# Patient Record
Sex: Male | Born: 1975 | Race: Black or African American | Hispanic: No | Marital: Single | Smoking: Never smoker
Health system: Southern US, Community
[De-identification: ages and names within clinical notes are randomized; demographics above are authoritative.]

## PROBLEM LIST (undated history)

## (undated) DIAGNOSIS — I10 Essential (primary) hypertension: Secondary | ICD-10-CM

## (undated) DIAGNOSIS — E119 Type 2 diabetes mellitus without complications: Secondary | ICD-10-CM

## (undated) DIAGNOSIS — N289 Disorder of kidney and ureter, unspecified: Secondary | ICD-10-CM

## (undated) HISTORY — PX: NO PAST SURGERIES: SHX2092

## (undated) HISTORY — DX: Type 2 diabetes mellitus without complications: E11.9

---

## 2010-04-25 ENCOUNTER — Emergency Department (HOSPITAL_COMMUNITY): Admission: EM | Admit: 2010-04-25 | Discharge: 2010-04-25 | Payer: Self-pay | Admitting: Emergency Medicine

## 2010-05-03 ENCOUNTER — Emergency Department (HOSPITAL_COMMUNITY): Admission: EM | Admit: 2010-05-03 | Discharge: 2010-05-03 | Payer: Self-pay | Admitting: Emergency Medicine

## 2010-08-11 ENCOUNTER — Emergency Department (HOSPITAL_BASED_OUTPATIENT_CLINIC_OR_DEPARTMENT_OTHER)
Admission: EM | Admit: 2010-08-11 | Discharge: 2010-08-11 | Payer: Self-pay | Source: Home / Self Care | Admitting: Emergency Medicine

## 2010-12-09 ENCOUNTER — Emergency Department (HOSPITAL_COMMUNITY)
Admission: EM | Admit: 2010-12-09 | Discharge: 2010-12-10 | Disposition: A | Discharge status: Home / Self Care | Payer: Self-pay | Attending: Emergency Medicine | Admitting: Emergency Medicine

## 2010-12-09 DIAGNOSIS — I10 Essential (primary) hypertension: Secondary | ICD-10-CM | POA: Insufficient documentation

## 2010-12-09 DIAGNOSIS — J069 Acute upper respiratory infection, unspecified: Secondary | ICD-10-CM | POA: Insufficient documentation

## 2010-12-09 DIAGNOSIS — R0989 Other specified symptoms and signs involving the circulatory and respiratory systems: Secondary | ICD-10-CM | POA: Insufficient documentation

## 2010-12-09 DIAGNOSIS — J029 Acute pharyngitis, unspecified: Secondary | ICD-10-CM | POA: Insufficient documentation

## 2010-12-09 DIAGNOSIS — R0609 Other forms of dyspnea: Secondary | ICD-10-CM | POA: Insufficient documentation

## 2010-12-09 DIAGNOSIS — R059 Cough, unspecified: Secondary | ICD-10-CM | POA: Insufficient documentation

## 2010-12-09 DIAGNOSIS — R05 Cough: Secondary | ICD-10-CM | POA: Insufficient documentation

## 2010-12-09 DIAGNOSIS — R079 Chest pain, unspecified: Secondary | ICD-10-CM | POA: Insufficient documentation

## 2010-12-09 DIAGNOSIS — N289 Disorder of kidney and ureter, unspecified: Secondary | ICD-10-CM | POA: Insufficient documentation

## 2010-12-10 ENCOUNTER — Emergency Department (HOSPITAL_COMMUNITY): Payer: Self-pay

## 2010-12-10 ENCOUNTER — Encounter (HOSPITAL_COMMUNITY): Payer: Self-pay

## 2010-12-10 LAB — DIFFERENTIAL
Basophils Absolute: 0 10*3/uL (ref 0.0–0.1)
Basophils Relative: 0 % (ref 0–1)
Eosinophils Relative: 3 % (ref 0–5)
Lymphocytes Relative: 15 % (ref 12–46)
Neutro Abs: 10.3 10*3/uL — ABNORMAL HIGH (ref 1.7–7.7)

## 2010-12-10 LAB — BASIC METABOLIC PANEL
BUN: 11 mg/dL (ref 6–23)
Calcium: 9 mg/dL (ref 8.4–10.5)
Chloride: 103 mEq/L (ref 96–112)
Creatinine, Ser: 1.48 mg/dL (ref 0.4–1.5)
GFR calc Af Amer: >60 mL/min (ref >60)
GFR calc non Af Amer: 54 mL/min — ABNORMAL LOW (ref >60)

## 2010-12-10 LAB — POCT I-STAT, CHEM 8
Chloride: 107 mEq/L (ref 96–112)
Glucose, Bld: 107 mg/dL — ABNORMAL HIGH (ref 70–99)
HCT: 46 % (ref 39.0–52.0)
Potassium: 3.9 mEq/L (ref 3.5–5.1)
Sodium: 144 mEq/L (ref 135–145)

## 2010-12-10 LAB — CBC
HCT: 44 % (ref 39.0–52.0)
RDW: 12.7 % (ref 11.5–15.5)
WBC: 14 10*3/uL — ABNORMAL HIGH (ref 4.0–10.5)

## 2010-12-10 LAB — RAPID STREP SCREEN (MED CTR MEBANE ONLY): Streptococcus, Group A Screen (Direct): NEGATIVE

## 2010-12-10 LAB — MONONUCLEOSIS SCREEN: Mono Screen: NEGATIVE

## 2010-12-10 LAB — D-DIMER, QUANTITATIVE: D-Dimer, Quant: 0.62 ug/mL-FEU — ABNORMAL HIGH (ref 0.00–0.48)

## 2010-12-10 MED ORDER — IOHEXOL 300 MG/ML  SOLN
100.0000 mL | Freq: Once | INTRAMUSCULAR | Status: AC | PRN
  Administered 2010-12-10: 100 mL via INTRAVENOUS

## 2011-05-01 ENCOUNTER — Encounter (HOSPITAL_BASED_OUTPATIENT_CLINIC_OR_DEPARTMENT_OTHER): Payer: Self-pay | Admitting: Family Medicine

## 2011-05-01 ENCOUNTER — Emergency Department (HOSPITAL_BASED_OUTPATIENT_CLINIC_OR_DEPARTMENT_OTHER)
Admission: EM | Admit: 2011-05-01 | Discharge: 2011-05-01 | Disposition: A | Discharge status: Home / Self Care | Payer: Self-pay | Attending: Emergency Medicine | Admitting: Emergency Medicine

## 2011-05-01 DIAGNOSIS — K0889 Other specified disorders of teeth and supporting structures: Secondary | ICD-10-CM

## 2011-05-01 DIAGNOSIS — K089 Disorder of teeth and supporting structures, unspecified: Secondary | ICD-10-CM | POA: Insufficient documentation

## 2011-05-01 MED ORDER — PENICILLIN V POTASSIUM 500 MG PO TABS: 500.0000 mg | ORAL_TABLET | Freq: Four times a day (QID) | ORAL | Status: AC

## 2011-05-01 MED ORDER — HYDROCODONE-ACETAMINOPHEN 5-325 MG PO TABS: 2.0000 | ORAL_TABLET | ORAL | Status: AC | PRN

## 2011-05-01 NOTE — ED Notes (Signed)
Pt c/o right upper toothache x 4-3 weeks.

## 2011-05-01 NOTE — ED Provider Notes (Signed)
Medical screening examination/treatment/procedure(s) were performed by non-physician practitioner and as supervising physician I was immediately available for consultation/collaboration.   Celene Kras, MD 05/01/11 [phone]

## 2011-05-01 NOTE — ED Provider Notes (Addendum)
History     CSN: [id number] Arrival date & time: 05/01/2011  5:42 PM   First MD Initiated Contact with Patient 05/01/11 1750      Chief Complaint  Patient presents with  . Dental Pain    (Consider location/radiation/quality/duration/timing/severity/associated sxs/prior treatment) Patient is a 35 y.o. male presenting with tooth pain. The history is provided by the patient. No language interpreter was used.  Dental PainThe primary symptoms include mouth pain and dental injury. The symptoms began more than 1 week ago. The symptoms are worsening. The symptoms are new. The symptoms occur constantly.  Additional symptoms include: gum tenderness. Medical issues do not include: alcohol problem and smoking.  Pt does not have a dentist  History reviewed. No pertinent past medical history.  History reviewed. No pertinent past surgical history.  No family history on file.  History  Substance Use Topics  . Smoking status: Never Smoker   . Smokeless tobacco: Not on file  . Alcohol Use: No      Review of Systems  HENT: Positive for dental problem.   All other systems reviewed and are negative.    Allergies  Review of patient's allergies indicates no known allergies.  Home Medications   Current Outpatient Rx  Name Route Sig Dispense Refill  . ACETAMINOPHEN 325 MG PO TABS Oral Take 650 mg by mouth every 6 (six) hours as needed. For pain       BP 135/84  Pulse 74  Temp(Src) 98.3 F (36.8 C) (Oral)  Resp 16  Ht 5\' 6"  (1.676 m)  Wt 175 lb (79.379 kg)  BMI 28.25 kg/m2  SpO2 100%  Physical Exam  Nursing note and vitals reviewed. Constitutional: He is oriented to person, place, and time. He appears well-developed and well-nourished.  HENT:  Head: Normocephalic and atraumatic.  Right Ear: External ear normal.  Left Ear: External ear normal.       Teeth no abscess  Eyes: Pupils are equal, round, and reactive to light.  Neck: Normal range of motion.  Cardiovascular:  Normal rate.   Pulmonary/Chest: Effort normal.  Musculoskeletal: Normal range of motion.  Neurological: He is alert and oriented to person, place, and time. He has normal reflexes.  Skin: Skin is warm.  Psychiatric: He has a normal mood and affect.    ED Course  Procedures (including critical care time)  Labs Reviewed - No data to display No results found.   No diagnosis found.    MDM   pt referred to dentist.         Langston Masker, PA 05/01/11 1828  Langston Masker, [place] 05/11/11 [phone]

## 2011-05-13 NOTE — ED Provider Notes (Signed)
Medical screening examination/treatment/procedure(s) were performed by non-physician practitioner and as supervising physician I was immediately available for consultation/collaboration.   Celene Kras, MD 05/13/11 [phone]

## 2011-05-26 ENCOUNTER — Emergency Department (HOSPITAL_BASED_OUTPATIENT_CLINIC_OR_DEPARTMENT_OTHER)
Admission: EM | Admit: 2011-05-26 | Discharge: 2011-05-26 | Disposition: A | Discharge status: Home / Self Care | Payer: Worker's Compensation | Attending: Emergency Medicine | Admitting: Emergency Medicine

## 2011-05-26 ENCOUNTER — Other Ambulatory Visit: Payer: Self-pay

## 2011-05-26 ENCOUNTER — Encounter (HOSPITAL_BASED_OUTPATIENT_CLINIC_OR_DEPARTMENT_OTHER): Payer: Self-pay | Admitting: *Deleted

## 2011-05-26 ENCOUNTER — Emergency Department (HOSPITAL_BASED_OUTPATIENT_CLINIC_OR_DEPARTMENT_OTHER): Payer: Worker's Compensation

## 2011-05-26 DIAGNOSIS — J40 Bronchitis, not specified as acute or chronic: Secondary | ICD-10-CM

## 2011-05-26 DIAGNOSIS — R054 Cough syncope: Secondary | ICD-10-CM

## 2011-05-26 DIAGNOSIS — R55 Syncope and collapse: Secondary | ICD-10-CM | POA: Insufficient documentation

## 2011-05-26 DIAGNOSIS — R05 Cough: Secondary | ICD-10-CM

## 2011-05-26 MED ORDER — DOXYCYCLINE HYCLATE 100 MG PO CAPS: 100.0000 mg | ORAL_CAPSULE | Freq: Two times a day (BID) | ORAL | Status: AC

## 2011-05-26 MED ORDER — HYDROCOD POLST-CHLORPHEN POLST 10-8 MG/5ML PO LQCR: 5.0000 mL | Freq: Two times a day (BID) | ORAL | Status: DC

## 2011-05-26 MED ORDER — HYDROCOD POLST-CHLORPHEN POLST 10-8 MG/5ML PO LQCR
5.0000 mL | Freq: Once | ORAL | Status: AC
  Administered 2011-05-26: 5 mL via ORAL
  Filled 2011-05-26: qty 5

## 2011-05-26 MED ORDER — ALBUTEROL SULFATE HFA 108 (90 BASE) MCG/ACT IN AERS
2.0000 | INHALATION_SPRAY | Freq: Once | RESPIRATORY_TRACT | Status: AC
  Administered 2011-05-26: 2 via RESPIRATORY_TRACT
  Filled 2011-05-26: qty 6.7
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
## 2011-05-26 NOTE — Discharge Instructions (Signed)
 Your EKG is normal.  I advise that you use inhaler 2 puffs every 6 hours while awake, then you may use just as needed after 48 hours.  Tussionex contains hydrocodone  which is a narcotic so use with caution.  I advise that you establish with a primary care provider and to have further evaluation with cardiology in the next 1-2 weeks.  Take the doxycycline  antibiotics to completion as prescribed.
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
## 2011-11-15 ENCOUNTER — Emergency Department (HOSPITAL_COMMUNITY)
Admission: EM | Admit: 2011-11-15 | Discharge: 2011-11-15 | Disposition: A | Discharge status: Home / Self Care | Payer: Self-pay | Attending: Emergency Medicine | Admitting: Emergency Medicine

## 2011-11-15 ENCOUNTER — Encounter (HOSPITAL_COMMUNITY): Payer: Self-pay | Admitting: Family Medicine

## 2011-11-15 DIAGNOSIS — T7840XA Allergy, unspecified, initial encounter: Secondary | ICD-10-CM | POA: Insufficient documentation

## 2011-11-15 DIAGNOSIS — I10 Essential (primary) hypertension: Secondary | ICD-10-CM | POA: Insufficient documentation

## 2011-11-15 DIAGNOSIS — R062 Wheezing: Secondary | ICD-10-CM | POA: Insufficient documentation

## 2011-11-15 DIAGNOSIS — R0602 Shortness of breath: Secondary | ICD-10-CM | POA: Insufficient documentation

## 2011-11-15 DIAGNOSIS — J3489 Other specified disorders of nose and nasal sinuses: Secondary | ICD-10-CM | POA: Insufficient documentation

## 2011-11-15 DIAGNOSIS — J9801 Acute bronchospasm: Secondary | ICD-10-CM | POA: Insufficient documentation

## 2011-11-15 MED ORDER — ALBUTEROL SULFATE HFA 108 (90 BASE) MCG/ACT IN AERS
2.0000 | INHALATION_SPRAY | RESPIRATORY_TRACT | Status: DC | PRN
  Administered 2011-11-15: 2 via RESPIRATORY_TRACT
  Filled 2011-11-15: qty 6.7

## 2011-11-15 MED ORDER — IPRATROPIUM BROMIDE 0.02 % IN SOLN
0.5000 mg | Freq: Once | RESPIRATORY_TRACT | Status: AC
  Administered 2011-11-15: 0.5 mg via RESPIRATORY_TRACT
  Filled 2011-11-15: qty 2.5

## 2011-11-15 MED ORDER — PREDNISONE 20 MG PO TABS: 60.0000 mg | ORAL_TABLET | Freq: Every day | ORAL | Status: AC

## 2011-11-15 MED ORDER — PREDNISONE 20 MG PO TABS
60.0000 mg | ORAL_TABLET | Freq: Once | ORAL | Status: AC
  Administered 2011-11-15: 60 mg via ORAL
  Filled 2011-11-15: qty 3

## 2011-11-15 MED ORDER — ALBUTEROL SULFATE (5 MG/ML) 0.5% IN NEBU
5.0000 mg | INHALATION_SOLUTION | Freq: Once | RESPIRATORY_TRACT | Status: AC
  Administered 2011-11-15: 5 mg via RESPIRATORY_TRACT
  Filled 2011-11-15: qty 1

## 2011-11-15 NOTE — ED Provider Notes (Signed)
History     CSN: [id number]  Arrival date & time 11/15/11  2014   First MD Initiated Contact with Patient 11/15/11 2103      Chief Complaint  Patient presents with  . Shortness of Breath    (Consider location/radiation/quality/duration/timing/severity/associated sxs/prior treatment) Patient is a 36 y.o. male presenting with shortness of breath. The history is provided by the patient.  Shortness of Breath  The current episode started today. The problem occurs continuously. The problem has been gradually worsening. The problem is moderate. The symptoms are relieved by nothing. Associated symptoms include rhinorrhea, shortness of breath and wheezing. Pertinent negatives include no chest pain and no fever. Associated symptoms comments: He began having trouble breathing after mowing the grass today without his usual mask. He denies fever but has had nasal congestion and sneezing since mowing as well. No known history of asthma, nonsmoker. He reports similar milder reactions in the past. .    History reviewed. No pertinent past medical history.  History reviewed. No pertinent past surgical history.  No family history on file.  History  Substance Use Topics  . Smoking status: Never Smoker   . Smokeless tobacco: Not on file  . Alcohol Use: No      Review of Systems  Constitutional: Negative for fever.  HENT: Positive for rhinorrhea.   Respiratory: Positive for shortness of breath and wheezing.   Cardiovascular: Negative for chest pain.    Allergies  Review of patient's allergies indicates no known allergies.  Home Medications   Current Outpatient Rx  Name Route Sig Dispense Refill  . DIPHENHYDRAMINE HCL 12.5 MG/5ML PO ELIX Oral Take 25 mg by mouth 4 (four) times daily as needed. For allergies    . GUAIFENESIN 100 MG/5ML PO SYRP Oral Take 200 mg by mouth 3 (three) times daily as needed. For cough      BP 141/78  Pulse 81  Temp(Src) 98.8 F (37.1 C) (Oral)  Resp 24   SpO2 97%  Physical Exam  Constitutional: He is oriented to person, place, and time. He appears well-developed and well-nourished. No distress.  Neck: Normal range of motion.  Cardiovascular: Normal rate.   No murmur heard. Pulmonary/Chest: Effort normal. No respiratory distress. He has wheezes.  Musculoskeletal: He exhibits no edema.  Neurological: He is alert and oriented to person, place, and time.    ED Course  Procedures (including critical care time)  Labs Reviewed - No data to display No results found.   No diagnosis found.  1. Bronchospasm 2. Allergies   MDM  No further wheezing after one nebulizer treatment. The patient feels better. Prednisone given here and will continue for the next 3 days. Recommend Zyrtec or Claritin for daily use.         Rodena Medin, PA-C 11/15/11 2237

## 2011-11-15 NOTE — Discharge Instructions (Signed)
FOLLOW UP WITH A PRIMARY CARE DOCTOR OF YOUR CHOICE FOR FURTHER MANAGEMENT OF BRONCHOSPASM AND RETURN HERE AS NEEDED.  Bronchospasm, Adult Bronchospasm means that there is a spasm or tightening of the airways going into the lungs. Because the airways go into a spasm and get smaller it makes breathing more difficult. For reasons not completely known, workings (functions) of the airways designed to protect the lungs become over active. This causes the airways to become more sensitive to:  Infection.   Weather.   Exercise.   Irritants.   Things that cause allergic reactions or allergies (allergens).  Frequent coughing or respiratory episodes should be checked for the cause. This condition may be made worse by exercise. CAUSES  Inflammation is often the cause of this condition. Allergy, viral respiratory infections, or irritants in the air often cause this problem. Allergic reactions produce immediate and delayed responses. Late reactions may produce more serious inflammation. This may lead to increased reactivity of the airways. Sometimes this is inherited. Some common triggers are:  Allergies.   Infection commonly triggers attacks. Antibiotics are not helpful for viral infections and usually do not help with attacks of bronchospasm.   Exercise (running, etc.) can trigger an attack. Proper pre-exercise medications help most individuals participate in sports. Swimming is the least likely sport to cause problems.   Irritants (for example, pollution, cigarette smoke, strong odors, aerosol sprays, paint fumes, etc.) may trigger attacks. You cannot smoke and do not allow smoking in your home. This is absolutely necessary. Show this instruction to mates, relatives and significant others that may not agree with you.   Weather changes may cause lung problems but moving around trying to find an ideal climate does not seem to be overly helpful. Winds increase molds and pollens in the air. Rain  refreshes the air by washing irritants out. Cold air may cause irritation.   Emotional problems do not cause lung problems but can trigger attacks.  SYMPTOMS  Wheezing is the most common symptom. Frequent coughing (with or without exercise and or crying) and repeated respiratory infections are all early warning signs of bronchospasm. Chest tightness and shortness of breath are other symptoms. DIAGNOSIS  Early hidden bronchospasm may go for long periods of time without being detected. This is especially true if wheezing cannot be detected by your caregiver. Lung (pulmonary) function studies may help with diagnosis in these cases. HOME CARE INSTRUCTIONS   It is necessary to remain calm during an attack. Try to relax and breathe more slowly. During this time medications may be given. If any breathing problems seem to be getting worse and are unresponsive to treatment seek immediate medical care.   If you have severe breathing difficulty or have had a life threatening attack it is probably a good idea for you to learn how to give adrenaline (epi-pen) or use an anaphylaxis kit. Your caregiver can help you with this. These are the same kits carried by people who have severe allergic reactions. This is especially important if you do not have readily accessible medical care.   With any severe breathing problems where epinephrine (adrenaline) has been given at home call 911 immediately as the delayed reaction may be even more severe.  SEEK MEDICAL CARE IF:   There is wheezing and shortness of breath, even if medications are given to prevent attacks.   An oral temperature above 102 F (38.9 C) develops.   There are muscle aches, chest pain, or thickening of sputum.   The sputum  changes from clear or white to yellow, green, gray, or bloody.   There are problems that may be related to the medicine you are given, such as a rash, itching, swelling, or trouble breathing.  SEEK IMMEDIATE MEDICAL CARE IF:     The usual medicines do not stop your wheezing, or there is increased coughing.   You have increased difficulty breathing.  MAKE SURE YOU:   Understand these instructions.   Will watch your condition.   Will get help right away if you are not doing well or get worse.  Document Released: 07/03/2003 Document Revised: 06/19/2011 Document Reviewed: 02/16/2008 [facility] Patient Information 2012 [place], [place].  Using Your Inhaler 1. Take the cap off the mouthpiece.  2. Shake the inhaler for 5 seconds.  3. Turn the inhaler so the bottle is above the mouthpiece. Hold it away from your mouth, at a distance of the width of 2 fingers.  4. Open your mouth widely, and tilt your head back slightly. Let your breath out.  5. Take a deep breath in slowly through your mouth. At the same time, push down on the bottle 1 time. You will feel the medicine enter your mouth and throat as you breathe.  6. Continue to take a deep breath in very slowly.  7. After you have breathed in completely, hold your breath for 10 seconds. This will help the medicine to settle in your lungs. If you cannot hold your breath for 10 seconds, hold it for as long as you can before you breathe out.  8. If your doctor has told you to take more than 1 puff, wait at least 1 minute between puffs. This will help you get the best results from your medicine.  9. If you use a steroid inhaler, rinse out your mouth after each dose.  10. Wash your inhaler once a day. Remove the bottle from the mouthpiece. Rinse the mouthpiece and cap with warm water. Dry everything well before you put the inhaler back together.  Document Released: 04/08/2008 Document Revised: 06/19/2011 Document Reviewed: 04/17/2009 [facility] Patient Information 2012 [place], [place].

## 2011-11-15 NOTE — ED Notes (Signed)
Respiratory paged for neb treatment.

## 2011-11-15 NOTE — ED Provider Notes (Signed)
Medical screening examination/treatment/procedure(s) were performed by non-physician practitioner and as supervising physician I was immediately available for consultation/collaboration.  Doug Sou, MD 11/15/11 2326

## 2011-11-15 NOTE — ED Notes (Signed)
Patient states he started having shortness of breath this evening after cutting the grass.

## 2012-11-12 IMAGING — CT CT ANGIO CHEST
2 of 8 series · 19 of 36 positions shown · IV contrast (APPLIED)
Comparison: Chest radiograph performed earlier today at [DATE] a.m.

CLINICAL DATA: Chest pain, cough and elevated D-dimer.
Leukocytosis.

CT ANGIOGRAPHY CHEST WITH CONTRAST
TECHNIQUE: Multidetector CT imaging of the chest was performed
using the standard protocol during bolus administration of
intravenous contrast.  Multiplanar CT image reconstructions
including MIPs were obtained to evaluate the vascular anatomy.
Contrast:  100 mL of Omnipaque 300 IV contrast

[Series 10: pe thins @ 1mm · axial · 0.63mm/px · z∈[+1225,+1463]mm · 17 of 268 slices shown]
[im 15/268  lung]
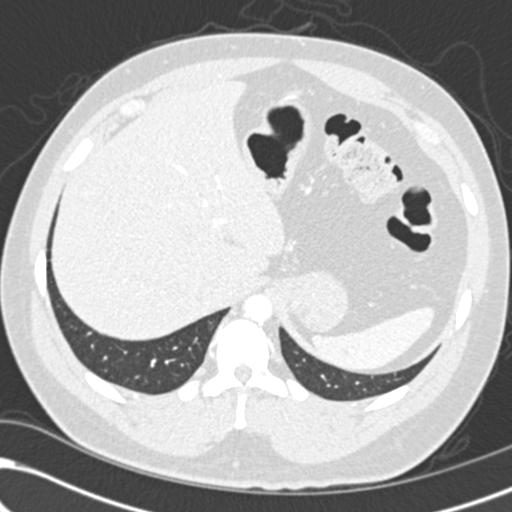
[im 30/268  mediastinal]
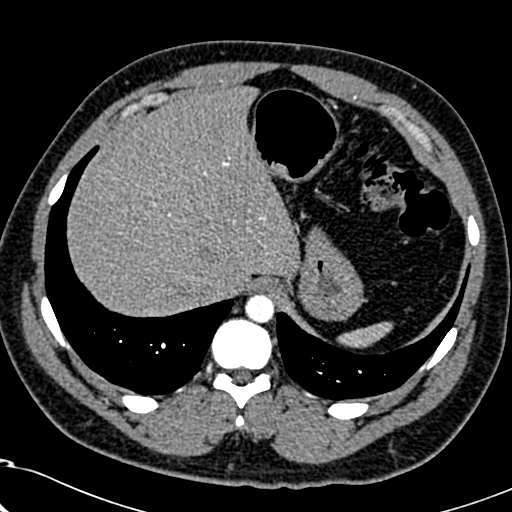
[im 45/268  lung]
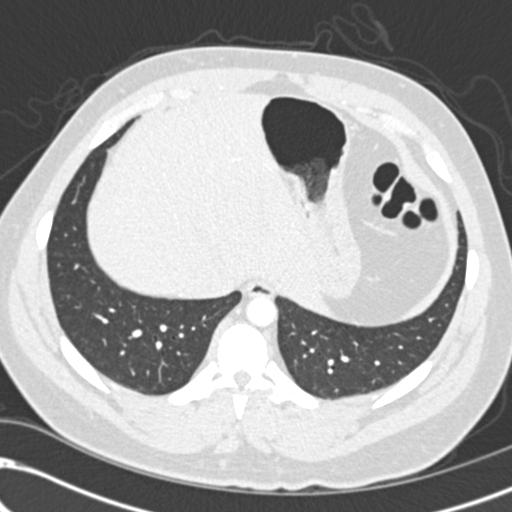
[im 60/268  mediastinal]
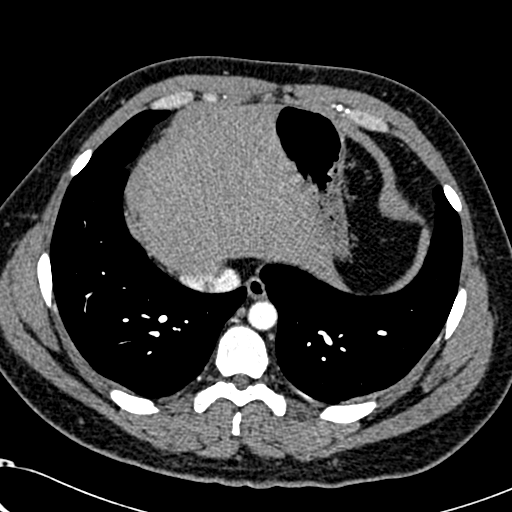
[im 75/268  lung]
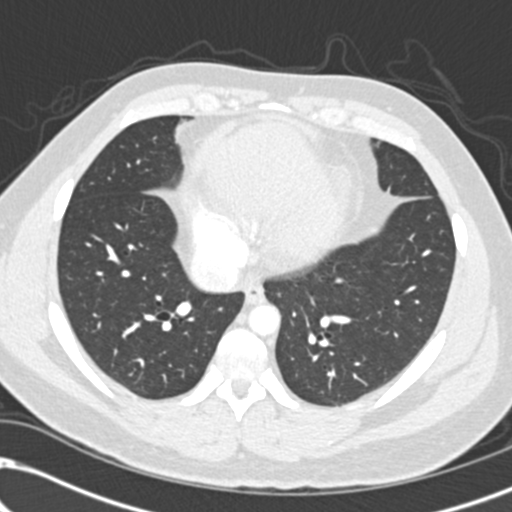
[im 90/268  mediastinal]
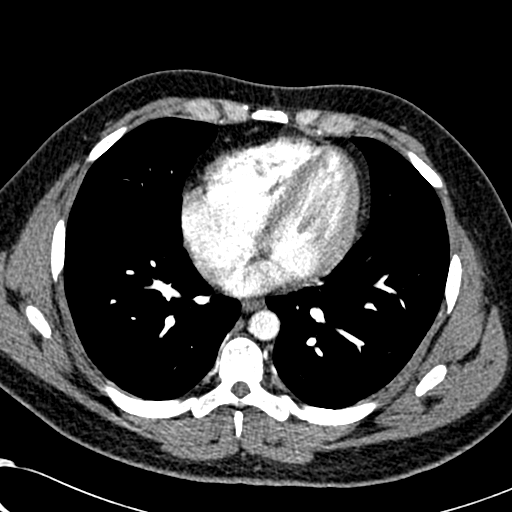
[im 104/268  lung]
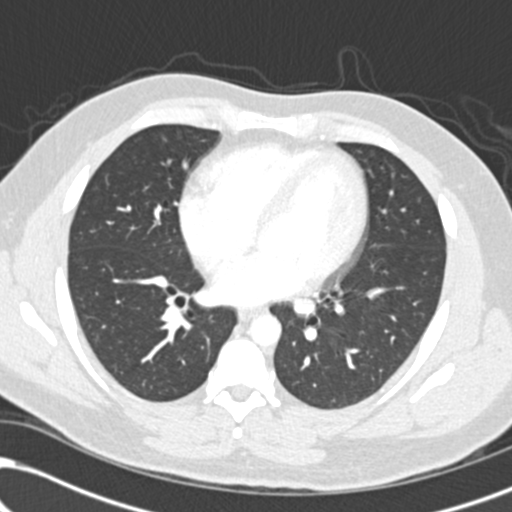
[im 119/268  mediastinal]
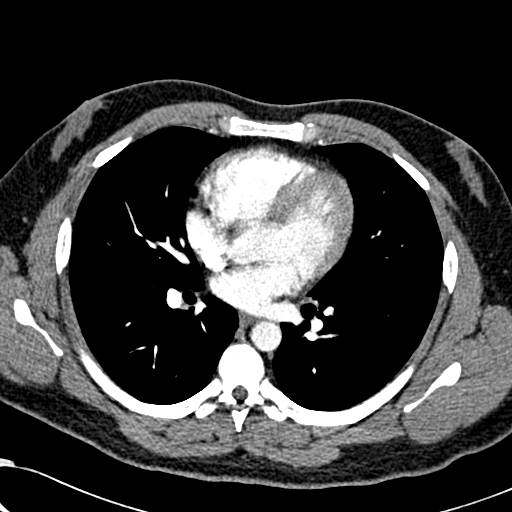
[im 134/268  lung]
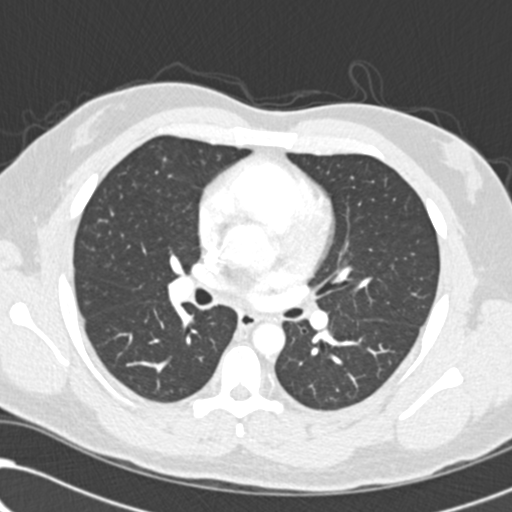
[im 149/268  mediastinal]
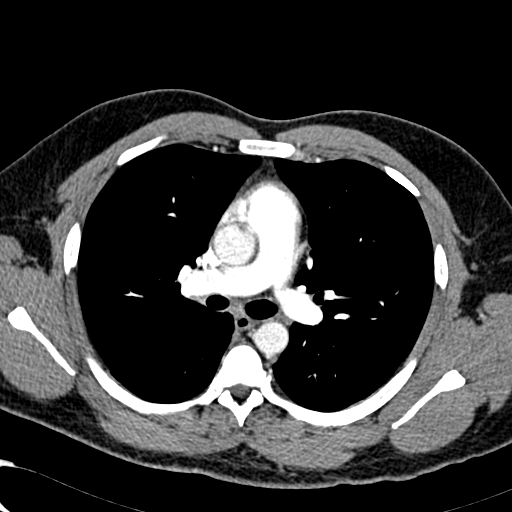
[im 164/268  lung]
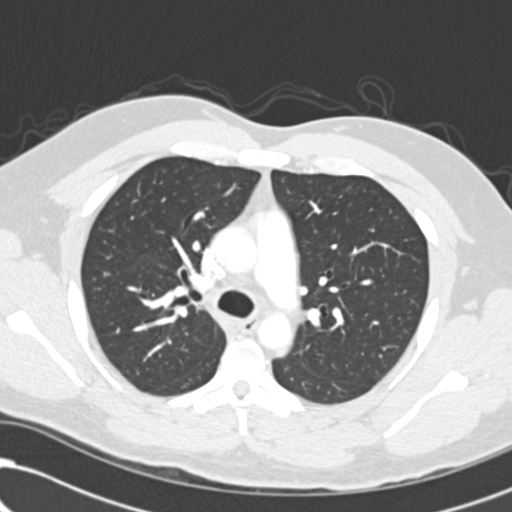
[im 179/268  mediastinal]
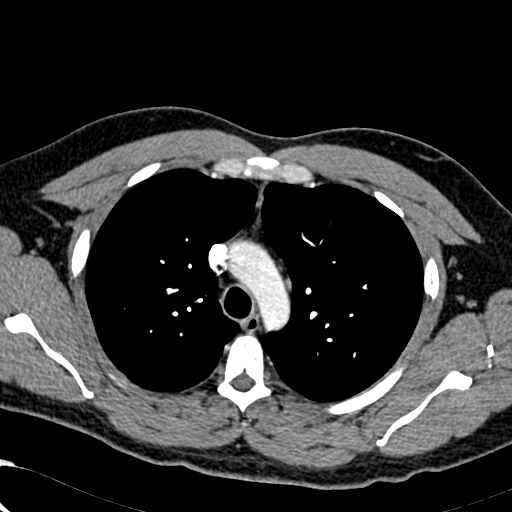
[im 193/268  lung]
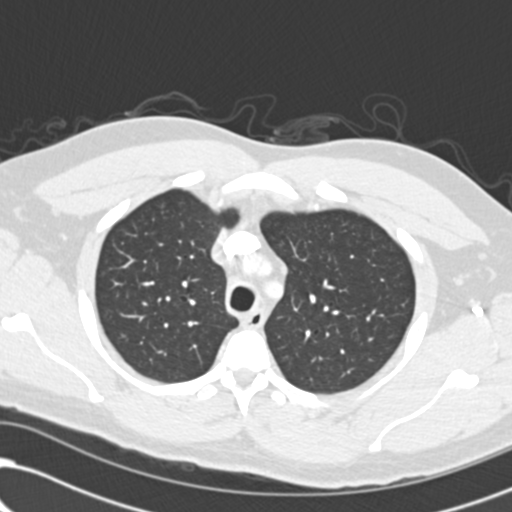
[im 208/268  mediastinal]
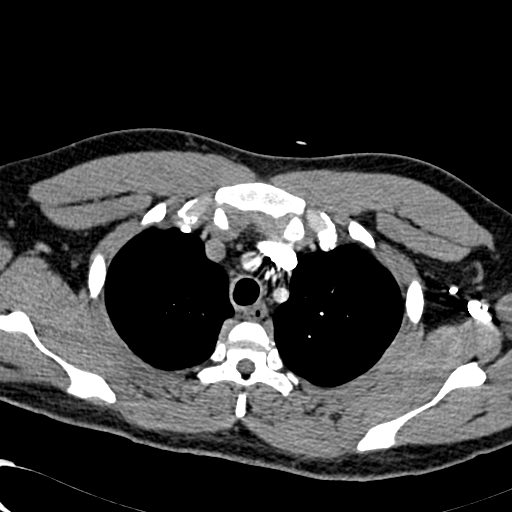
[im 223/268  lung]
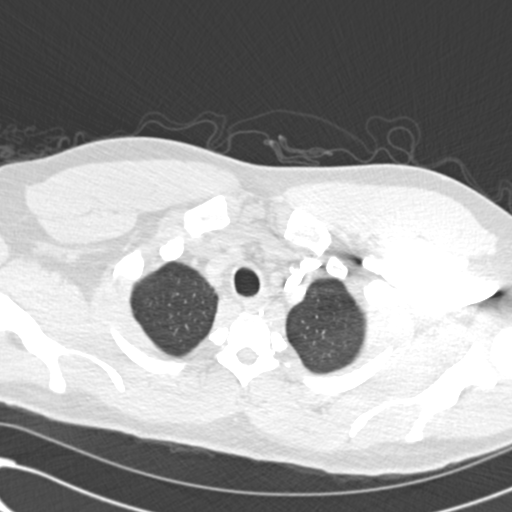
[im 238/268  mediastinal]
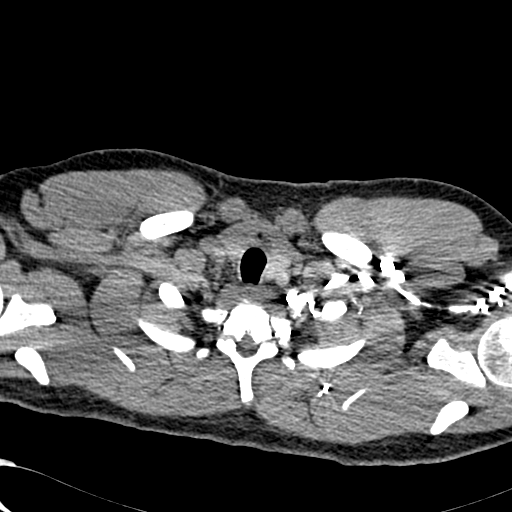
[im 253/268  lung]
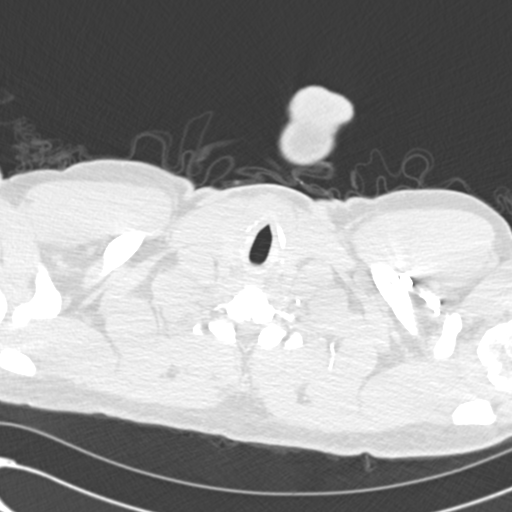

[Series 604: <mpr thick range> · coronal · 0.63mm/px · 2 of 101 slices shown]
[im 34/101  mediastinal]
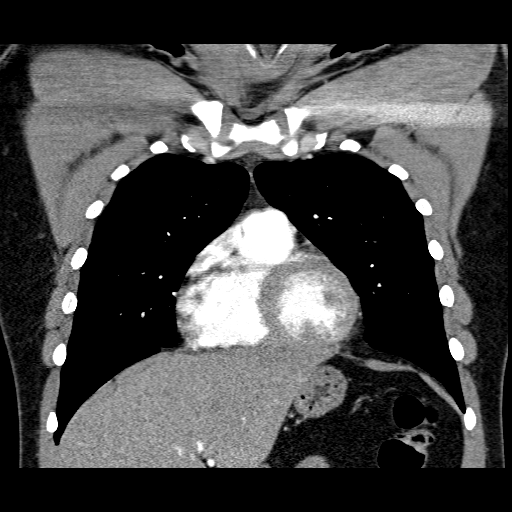
[im 67/101  mediastinal]
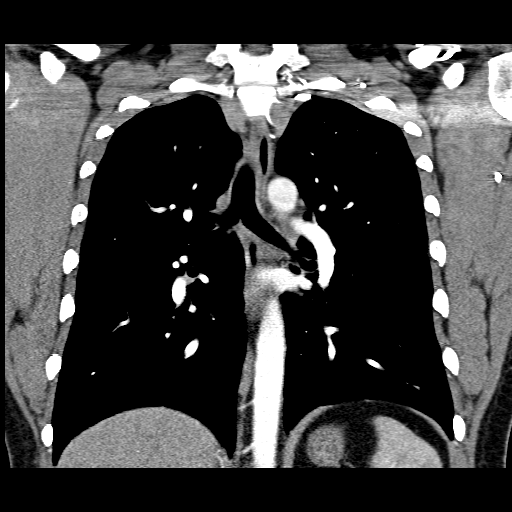

[19 of 36 positions shown; findings below may reference images not displayed]

FINDINGS: There is no evidence of pulmonary embolus.

The lungs are clear bilaterally.  There is no evidence of
significant focal consolidation, pleural effusion or pneumothorax.
No masses are identified; no abnormal focal contrast enhancement is
seen.

The mediastinum is unremarkable in appearance.  Scattered small
aortopulmonary window, precarinal, right paratracheal and
periaortic nodes remain within normal limits.  No mediastinal
lymphadenopathy is seen.  The great vessels are unremarkable in
appearance.  No pericardial effusion is identified.  No axillary
lymphadenopathy is seen.  The thyroid gland is unremarkable in
appearance.

The visualized portions of the liver and spleen are unremarkable.
Minimal bilateral gynecomastia is noted.

No acute osseous abnormalities are seen.

Review of the MIP images confirms the above findings.
IMPRESSION: 1.  No evidence of pulmonary embolus.
2.  Lungs clear bilaterally.
3.  Minimal bilateral gynecomastia incidentally noted.

## 2013-05-22 ENCOUNTER — Encounter (HOSPITAL_COMMUNITY): Payer: Self-pay | Admitting: Emergency Medicine

## 2013-05-22 ENCOUNTER — Emergency Department (HOSPITAL_COMMUNITY)
Admission: EM | Admit: 2013-05-22 | Discharge: 2013-05-22 | Disposition: A | Discharge status: Home / Self Care | Payer: Worker's Compensation | Attending: Emergency Medicine | Admitting: Emergency Medicine

## 2013-05-22 DIAGNOSIS — K047 Periapical abscess without sinus: Secondary | ICD-10-CM | POA: Insufficient documentation

## 2013-05-22 MED ORDER — PENICILLIN V POTASSIUM 500 MG PO TABS: 500.0000 mg | ORAL_TABLET | Freq: Four times a day (QID) | ORAL | Status: AC

## 2013-05-22 MED ORDER — OXYCODONE-ACETAMINOPHEN 5-325 MG PO TABS: 2.0000 | ORAL_TABLET | ORAL | Status: DC | PRN

## 2013-05-22 NOTE — ED Notes (Signed)
Pt had toothache and attempted to pull tooth himself. Pt does not believe that he removed the tooth completely and is causing severe pain. Pt is A&O and in NAD

## 2013-05-22 NOTE — ED Provider Notes (Signed)
CSN: [id number]     Arrival date & time 05/22/13  1558 History   First MD Initiated Contact with Patient 05/22/13 1631     Chief Complaint  Patient presents with  . Dental Pain   (Consider location/radiation/quality/duration/timing/severity/associated sxs/prior Treatment) HPI Comments: Patient presents with pain to his teeth. He states he's had a two-week history of some worsening throbbing pain to his left upper tooth. He attempted to pull a tooth out himself with some pliers. He feels like he might have gotten all the tooth out. Over the last few days he's noted a little bit of swelling around the tooth. He denies any fevers or vomiting. He currently does not have a dentist.  Patient is a 37 y.o. male presenting with tooth pain.  Dental Pain Associated symptoms: no facial swelling, no fever and no headaches     History reviewed. No pertinent past medical history. History reviewed. No pertinent past surgical history. No family history on file. History  Substance Use Topics  . Smoking status: Never Smoker   . Smokeless tobacco: Not on file  . Alcohol Use: No    Review of Systems  Constitutional: Negative for fever.  HENT: Positive for dental problem. Negative for facial swelling.   Gastrointestinal: Negative for nausea and vomiting.  Skin: Negative for rash and wound.  Neurological: Negative for light-headedness and headaches.    Allergies  Review of patient's allergies indicates no known allergies.  Home Medications   Current Outpatient Rx  Name  Route  Sig  Dispense  Refill  . diphenhydrAMINE (BENADRYL) 12.5 MG/5ML elixir   Oral   Take 25 mg by mouth 4 (four) times daily as needed. For allergies         . guaifenesin (ROBITUSSIN) 100 MG/5ML syrup   Oral   Take 200 mg by mouth 3 (three) times daily as needed. For cough         . oxyCODONE-acetaminophen (PERCOCET) 5-325 MG per tablet   Oral   Take 2 tablets by mouth every 4 (four) hours as needed.   15 tablet  0   . penicillin v potassium (VEETID) 500 MG tablet   Oral   Take 1 tablet (500 mg total) by mouth 4 (four) times daily.   40 tablet   0    BP 153/81  Pulse 71  Temp(Src) 99 F (37.2 C) (Oral)  Resp 20  SpO2 100% Physical Exam  Constitutional: He is oriented to person, place, and time. He appears well-developed and well-nourished.  HENT:  Patient has tenderness around the socket of the left upper first molar. There is a small pocket of swelling adjacent to the area. There's no other facial swelling. There is no drainage. There is no induration to the area. There's no trismus.  Cardiovascular: Normal rate.   Pulmonary/Chest: Effort normal.  Neurological: He is alert and oriented to person, place, and time.  Skin: Skin is warm and dry.    ED Course  Procedures (including critical care time) Labs Review Labs Reviewed - No data to display Imaging Review No results found.  EKG Interpretation   None       MDM   1. Periapical abscess    Patient a small. Abscess. He's otherwise nontoxic appearing. I gave him a referral to follow up with the on-call dentist. Advised to return here for symptoms worsen. He was started on penicillin and Percocet.    Rolan Bucco, MD 05/22/13 2240

## 2014-02-17 ENCOUNTER — Emergency Department (HOSPITAL_BASED_OUTPATIENT_CLINIC_OR_DEPARTMENT_OTHER)
Admission: EM | Admit: 2014-02-17 | Discharge: 2014-02-17 | Disposition: A | Discharge status: Home / Self Care | Payer: Worker's Compensation | Attending: Emergency Medicine | Admitting: Emergency Medicine

## 2014-02-17 ENCOUNTER — Encounter (HOSPITAL_BASED_OUTPATIENT_CLINIC_OR_DEPARTMENT_OTHER): Payer: Self-pay | Admitting: Emergency Medicine

## 2014-02-17 DIAGNOSIS — K0889 Other specified disorders of teeth and supporting structures: Secondary | ICD-10-CM

## 2014-02-17 DIAGNOSIS — K089 Disorder of teeth and supporting structures, unspecified: Secondary | ICD-10-CM | POA: Insufficient documentation

## 2014-02-17 DIAGNOSIS — K029 Dental caries, unspecified: Secondary | ICD-10-CM | POA: Insufficient documentation

## 2014-02-17 DIAGNOSIS — Z79899 Other long term (current) drug therapy: Secondary | ICD-10-CM | POA: Insufficient documentation

## 2014-02-17 MED ORDER — HYDROCODONE-ACETAMINOPHEN 5-325 MG PO TABS: 1.0000 | ORAL_TABLET | ORAL | Status: DC | PRN

## 2014-02-17 MED ORDER — PENICILLIN V POTASSIUM 500 MG PO TABS: 500.0000 mg | ORAL_TABLET | Freq: Three times a day (TID) | ORAL | Status: DC

## 2014-02-17 NOTE — Discharge Instructions (Signed)
Dental Pain °A tooth ache may be caused by cavities (tooth decay). Cavities expose the nerve of the tooth to air and hot or cold temperatures. It may come from an infection or abscess (also called a boil or furuncle) around your tooth. It is also often caused by dental caries (tooth decay). This causes the pain you are having. °DIAGNOSIS  °Your caregiver can diagnose this problem by exam. °TREATMENT  °· If caused by an infection, it may be treated with medications which kill germs (antibiotics) and pain medications as prescribed by your caregiver. Take medications as directed. °· Only take over-the-counter or prescription medicines for pain, discomfort, or fever as directed by your caregiver. °· Whether the tooth ache today is caused by infection or dental disease, you should see your dentist as soon as possible for further care. °SEEK MEDICAL CARE IF: °The exam and treatment you received today has been provided on an emergency basis only. This is not a substitute for complete medical or dental care. If your problem worsens or new problems (symptoms) appear, and you are unable to meet with your dentist, call or return to this location. °SEEK IMMEDIATE MEDICAL CARE IF:  °· You have a fever. °· You develop redness and swelling of your face, jaw, or neck. °· You are unable to open your mouth. °· You have severe pain uncontrolled by pain medicine. °MAKE SURE YOU:  °· Understand these instructions. °· Will watch your condition. °· Will get help right away if you are not doing well or get worse. °Document Released: 06/30/2005 Document Revised: 09/22/2011 Document Reviewed: 02/16/2008 °ExitCare® Patient Information ©2015 [employer]. This information is not intended to replace advice given to you by your health care provider. Make sure you discuss any questions you have with your health care provider. ° °Dental Care and Dentist Visits °Dental care supports good overall health. Regular dental visits can also help you  avoid dental pain, bleeding, infection, and other more serious health problems in the future. It is important to keep the mouth healthy because diseases in the teeth, gums, and other oral tissues can spread to other areas of the body. Some problems, such as diabetes, heart disease, and pre-term labor have been associated with poor oral health.  °See your dentist every 6 months. If you experience emergency problems such as a toothache or broken tooth, go to the dentist right away. If you see your dentist regularly, you may catch problems early. It is easier to be treated for problems in the early stages.  °WHAT TO EXPECT AT A DENTIST VISIT  °Your dentist will look for many common oral health problems and recommend proper treatment. At your regular dental visit, you can expect: °· Gentle cleaning of the teeth and gums. This includes scraping and polishing. This helps to remove the sticky substance around the teeth and gums (plaque). Plaque forms in the mouth shortly after eating. Over time, plaque hardens on the teeth as tartar. If tartar is not removed regularly, it can cause problems. Cleaning also helps remove stains. °· Periodic X-rays. These pictures of the teeth and supporting bone will help your dentist assess the health of your teeth. °· Periodic fluoride treatments. Fluoride is a natural mineral shown to help strengthen teeth. Fluoride treatment involves applying a fluoride gel or varnish to the teeth. It is most commonly done in children. °· Examination of the mouth, tongue, jaws, teeth, and gums to look for any oral health problems, such as: °¨ Cavities (dental caries). This is   decay on the tooth caused by plaque, sugar, and acid in the mouth. It is best to catch a cavity when it is small. °¨ Inflammation of the gums caused by plaque buildup (gingivitis). °¨ Problems with the mouth or malformed or misaligned teeth. °¨ Oral cancer or other diseases of the soft tissues or jaws.  °KEEP YOUR TEETH AND GUMS  HEALTHY °For healthy teeth and gums, follow these general guidelines as well as your dentist's specific advice: °· Have your teeth professionally cleaned at the dentist every 6 months. °· Brush twice daily with a fluoride toothpaste. °· Floss your teeth daily.  °· Ask your dentist if you need fluoride supplements, treatments, or fluoride toothpaste. °· Eat a healthy diet. Reduce foods and drinks with added sugar. °· Avoid smoking. °TREATMENT FOR ORAL HEALTH PROBLEMS °If you have oral health problems, treatment varies depending on the conditions present in your teeth and gums. °· Your caregiver will most likely recommend good oral hygiene at each visit. °· For cavities, gingivitis, or other oral health disease, your caregiver will perform a procedure to treat the problem. This is typically done at a separate appointment. Sometimes your caregiver will refer you to another dental specialist for specific tooth problems or for surgery. °SEEK IMMEDIATE DENTAL CARE IF: °· You have pain, bleeding, or soreness in the gum, tooth, jaw, or mouth area. °· A permanent tooth becomes loose or separated from the gum socket. °· You experience a blow or injury to the mouth or jaw area. °Document Released: 03/12/2011 Document Revised: 09/22/2011 Document Reviewed: 03/12/2011 °ExitCare® Patient Information ©2015 [employer]. This information is not intended to replace advice given to you by your health care provider. Make sure you discuss any questions you have with your health care provider. ° ° °Emergency Department Resource Guide °1) Find a Doctor and Pay Out of Pocket °Although you won't have to find out who is covered by your insurance plan, it is a good idea to ask around and get recommendations. You will then need to call the office and see if the doctor you have chosen will accept you as a new patient and what types of options they offer for patients who are self-pay. Some doctors offer discounts or will set up payment plans  for their patients who do not have insurance, but you will need to ask so you aren't surprised when you get to your appointment. ° °2) Contact Your Local Health Department °Not all health departments have doctors that can see patients for sick visits, but many do, so it is worth a call to see if yours does. If you don't know where your local health department is, you can check in your phone book. The CDC also has a tool to help you locate your state's health department, and many state websites also have listings of all of their local health departments. ° °3) Find a Walk-in Clinic °If your illness is not likely to be very severe or complicated, you may want to try a walk in clinic. These are popping up all over the country in pharmacies, drugstores, and shopping centers. They're usually staffed by nurse practitioners or physician assistants that have been trained to treat common illnesses and complaints. They're usually fairly quick and inexpensive. However, if you have serious medical issues or chronic medical problems, these are probably not your best option. ° °No Primary Care Doctor: °- Call [facility] at  832-8000 - they can help you locate a primary care doctor that  accepts your   insurance, provides certain services, etc. °- Physician Referral Service- 1-800-533-3463 ° °Chronic Pain Problems: °Organization         Address  Phone   Notes  °[facility]  ([phone]) [phone] Patients need to be referred by their primary care doctor.  ° °Medication Assistance: °Organization         Address  Phone   Notes  °[place] [facility] [address] °[place], [place] [address] (336) 641-8030 --Must be a resident of [place] °-- Must have NO insurance coverage whatsoever (no Medicaid/ Medicare, etc.) °-- The pt. MUST have a primary care doctor that directs their care regularly and follows them in the community °  °[facility]  (866) 331-1348   °[employer]  (888)  892-1162   ° °Agencies that provide inexpensive medical care: °Organization         Address  Phone   Notes  °[facility]  (336) 832-8035   °[facility]    (336) 832-7272   °[facility] [address] °[place], [place] [address] (336) 832-4777   °[facility] [address], °[place] (336) 271-4999   °[facility]    (336) 373-0678   °[facility]    (336) 272-1050   °[facility] ° [address], [place] Phone:  (336) 832-4444, Fax:  (336) 832-4440 Hours of Operation:  9 am - 6 pm, M-F.  Also accepts Medicaid/Medicare and self-pay.  °[facility] ° [address], [place] Phone: (336) [phone], Fax: (336) [phone]. Hours of Operation:  8:30 am - 5:30 pm, M-F.  Also accepts Medicaid and self-pay.  °[facility] [place] [address], [place] Phone: (336) [phone]   °[facility] [address], [place], [place] (336)723-1848, Ext. 123 Mondays & Thursdays: 7-9 AM.  First 15 patients are seen on a first come, first serve basis. °  ° °Medicaid-accepting [place] Providers: ° °Organization         Address  Phone   Notes  °[facility] [address], [place] ([phone]) [phone] Also accepts self-pay patients.  °[facility] [address], [place] ° (336) [phone]   °[facility] [address], [place] (336) [phone]   °[facility] [address] [place] [address], [place] (336) [phone]   °[facility] [address], [place]  ° (336) [phone] Only accepts [place] Access Medicaid patients after they have their name applied to their card.  ° °Self-Pay (no insurance) in [place]: ° °Organization         Address  Phone   Notes  °Sickle Cell Patients, [facility] [address], [place] (336)  [phone]   °[facility] [address], [place] (336) 832-4400   °[facility] ° [address],  (336) [phone]   °[facility]/Dr. Osei-Bonsu ° [address] [place] [address], [place] or [address], [place] (336) [phone] Phone number for both [place] and [place] locations is the same.  °[facility] [address], [place] (336) [phone]   °[facility] [address] [place] [address], [place] or [address] (336) 852-7530 °(336) 878-2260   °[facility] [address], [place] (336) 350-1642, phone; (336) 294-5005, fax Sees patients 1st and 3rd Saturday of every month.  Must not qualify for public or private insurance (i.e. Medicaid, Medicare, [place] Health Choice, Veterans' Benefits) • Household income should be no more than 200% of the poverty level •The clinic cannot treat you if you are pregnant or think you are pregnant • Sexually transmitted diseases are not treated at the clinic.  ° ° °Dental Care: °Organization         Address  Phone  Notes  °[facility] [address], [place] (336) 641-6152 Accepts children up to age 21 who are enrolled in Medicaid or [place] Health Choice; pregnant women with a Medicaid card; and children who have applied for Medicaid or [place] Health Choice, but were declined, whose parents can pay a reduced fee at time of service.  °[facility] [place]  [address], [place] (336) 641-7733 Accepts children up to age 21 who are enrolled in Medicaid or [place] Health Choice; pregnant women with a Medicaid card; and children who have applied for Medicaid or [place] Health Choice, but were declined, whose parents can pay a reduced fee at time of service.  °[facility] ° [address], [place] (336) [phone] Patients are  seen by appointment only. Walk-ins are not accepted. [facility] will see patients 18 years of age and older. °Monday - Tuesday (8am-5pm) °Most Wednesdays (8:30-5pm) °$30 per visit, cash only  °[facility] ° [address], [place] (336) [phone] Patients are seen by appointment only. Walk-ins are not accepted. [facility] will see patients 18 years of age and older. °One Wednesday Evening (Monthly: Volunteer Based).  $30 per visit, cash only  °[facility]  ([phone]) 537-3737 for adults; Children under age 4, call Graduate Pediatric Dentistry at ([phone]) [phone]. Children aged 4-14, please call ([phone]) [phone] to request a pediatric application. ° Dental services are provided in all areas of dental care including fillings, crowns and bridges, complete and partial dentures, implants, gum treatment, root canals, and extractions. Preventive care is also provided. Treatment is provided to both adults and children. °Patients are selected via a lottery and there is often a waiting list. °  °[facility] [address], °[place] ° (336) 763-8833 [email or web address] °  °[facility] [address], [place], [place] (336)723-1848, Ext. 123 Second and Fourth Thursday of each month, opens at 6:30 AM; Clinic ends at 9 AM.  Patients are seen on a first-come first-served basis, and a limited number are seen during each clinic.  ° °[facility] ° [address], [place], [place] (336) [phone]   Eligibility Requirements °You must have lived in [place], [place], or [place] counties for at least the last three months. °  You cannot be eligible for state or federal sponsored healthcare insurance, including Veterans Administration, Medicaid, or Medicare. °  You generally cannot be eligible for healthcare insurance through your employer.  °  How to apply: °Eligibility screenings are held every Tuesday and Wednesday afternoon from 1:00 pm until 4:00  pm. You do not need an appointment for the interview!  °[facility] [address], [place],  [phone]   °[facility]  [phone]   °[facility]  [phone]   °[facility]  [phone]   ° °

## 2014-02-17 NOTE — ED Notes (Signed)
Broken tooth on left upper x1 month.  Started hurting today.

## 2014-03-07 NOTE — ED Provider Notes (Signed)
CSN: [id number]     Arrival date & time 02/17/14  1624 History   First MD Initiated Contact with Patient 02/17/14 1630     Chief Complaint  Patient presents with  . Dental Pain     (Consider location/radiation/quality/duration/timing/severity/associated sxs/prior Treatment) Patient is a 38 y.o. male presenting with tooth pain. The history is provided by the patient.  Dental Pain Location:  Upper Upper teeth location:  14/LU 1st molar Severity:  Moderate Associated symptoms: no facial swelling and no fever   Associated symptoms comment:  He reports a tooth on the left upper rear broke over one month ago and started hurting today. No facial swelling or fever.    History reviewed. No pertinent past medical history. History reviewed. No pertinent past surgical history. No family history on file. History  Substance Use Topics  . Smoking status: Never Smoker   . Smokeless tobacco: Not on file  . Alcohol Use: No    Review of Systems  Constitutional: Negative for fever.  HENT: Positive for dental problem. Negative for facial swelling, sore throat and trouble swallowing.   Gastrointestinal: Negative for nausea.      Allergies  Review of patient's allergies indicates no known allergies.  Home Medications   Prior to Admission medications   Medication Sig Start Date End Date Taking? Authorizing Provider  diphenhydrAMINE (BENADRYL) 12.5 MG/5ML elixir Take 25 mg by mouth 4 (four) times daily as needed. For allergies    Historical Provider, MD  guaifenesin (ROBITUSSIN) 100 MG/5ML syrup Take 200 mg by mouth 3 (three) times daily as needed. For cough    Historical Provider, MD  HYDROcodone-acetaminophen (NORCO/VICODIN) 5-325 MG per tablet Take 1-2 tablets by mouth every 4 (four) hours as needed. 02/17/14    A , PA-C  oxyCODONE-acetaminophen (PERCOCET) 5-325 MG per tablet Take 2 tablets by mouth every 4 (four) hours as needed. 05/22/13   Rolan Bucco, MD  penicillin v potassium  (VEETID) 500 MG tablet Take 1 tablet (500 mg total) by mouth 3 (three) times daily. 02/17/14    A , PA-C   BP 134/81  Pulse 80  Temp(Src) 98.9 F (37.2 C) (Oral)  Resp 16  Ht  (1.676 m)  Wt 160 lb (72.576 kg)  BMI 25.84 kg/m2  SpO2 99% Physical Exam  Constitutional: He is oriented to person, place, and time. He appears well-developed and well-nourished. No distress.  HENT:  Mouth/Throat: Oropharynx is clear and moist.  Chronic decay of #13 without visualized abscess. No facial swelling.  Neck: Normal range of motion.  Lymphadenopathy:    He has no cervical adenopathy.  Neurological: He is alert and oriented to person, place, and time.  Skin: Skin is dry.  Psychiatric: He has a normal mood and affect.    ED Course  Procedures (including critical care time) Labs Review Labs Reviewed - No data to display  Imaging Review No results found.   EKG Interpretation None      MDM   Final diagnoses:  Toothache    Uncomplicated dental decay. Refer to dentistry.    Arnoldo Hooker, PA-C 03/07/14 1535

## 2014-03-08 NOTE — ED Provider Notes (Signed)
Medical screening examination/treatment/procedure(s) were performed by non-physician practitioner and as supervising physician I was immediately available for consultation/collaboration.   EKG Interpretation None         T , MD 03/08/14 0902 

## 2015-06-03 ENCOUNTER — Emergency Department (HOSPITAL_BASED_OUTPATIENT_CLINIC_OR_DEPARTMENT_OTHER)
Admission: EM | Admit: 2015-06-03 | Discharge: 2015-06-03 | Disposition: A | Discharge status: Home / Self Care | Payer: Worker's Compensation | Attending: Emergency Medicine | Admitting: Emergency Medicine

## 2015-06-03 ENCOUNTER — Encounter (HOSPITAL_BASED_OUTPATIENT_CLINIC_OR_DEPARTMENT_OTHER): Payer: Self-pay | Admitting: *Deleted

## 2015-06-03 DIAGNOSIS — I10 Essential (primary) hypertension: Secondary | ICD-10-CM | POA: Insufficient documentation

## 2015-06-03 DIAGNOSIS — Z87448 Personal history of other diseases of urinary system: Secondary | ICD-10-CM | POA: Insufficient documentation

## 2015-06-03 DIAGNOSIS — S6992XA Unspecified injury of left wrist, hand and finger(s), initial encounter: Secondary | ICD-10-CM | POA: Insufficient documentation

## 2015-06-03 DIAGNOSIS — Y9289 Other specified places as the place of occurrence of the external cause: Secondary | ICD-10-CM | POA: Insufficient documentation

## 2015-06-03 DIAGNOSIS — Y998 Other external cause status: Secondary | ICD-10-CM | POA: Insufficient documentation

## 2015-06-03 DIAGNOSIS — Y9389 Activity, other specified: Secondary | ICD-10-CM | POA: Insufficient documentation

## 2015-06-03 DIAGNOSIS — S61452A Open bite of left hand, initial encounter: Secondary | ICD-10-CM

## 2015-06-03 DIAGNOSIS — W5501XA Bitten by cat, initial encounter: Secondary | ICD-10-CM | POA: Insufficient documentation

## 2015-06-03 DIAGNOSIS — Z23 Encounter for immunization: Secondary | ICD-10-CM | POA: Insufficient documentation

## 2015-06-03 DIAGNOSIS — Z792 Long term (current) use of antibiotics: Secondary | ICD-10-CM | POA: Insufficient documentation

## 2015-06-03 HISTORY — DX: Essential (primary) hypertension: I10

## 2015-06-03 HISTORY — DX: Disorder of kidney and ureter, unspecified: N28.9

## 2015-06-03 MED ORDER — RABIES IMMUNE GLOBULIN 150 UNIT/ML IM INJ
20.0000 [IU]/kg | INJECTION | Freq: Once | INTRAMUSCULAR | Status: AC
  Administered 2015-06-03: 1725 [IU] via INTRAMUSCULAR
  Filled 2015-06-03: qty 12

## 2015-06-03 MED ORDER — TETANUS-DIPHTH-ACELL PERTUSSIS 5-2.5-18.5 LF-MCG/0.5 IM SUSP
0.5000 mL | Freq: Once | INTRAMUSCULAR | Status: AC
  Administered 2015-06-03: 0.5 mL via INTRAMUSCULAR
  Filled 2015-06-03: qty 0.5

## 2015-06-03 MED ORDER — RABIES IMMUNE GLOBULIN 150 UNIT/ML IM INJ: 20.0000 [IU]/kg | INJECTION | Freq: Once | INTRAMUSCULAR | Status: DC

## 2015-06-03 MED ORDER — DOXYCYCLINE HYCLATE 100 MG PO TABS
100.0000 mg | ORAL_TABLET | Freq: Once | ORAL | Status: AC
  Administered 2015-06-03: 100 mg via ORAL
  Filled 2015-06-03: qty 1

## 2015-06-03 MED ORDER — DOXYCYCLINE HYCLATE 100 MG PO CAPS: 100.0000 mg | ORAL_CAPSULE | Freq: Two times a day (BID) | ORAL | Status: DC

## 2015-06-03 MED ORDER — RABIES VACCINE, PCEC IM SUSR
1.0000 mL | Freq: Once | INTRAMUSCULAR | Status: AC
  Administered 2015-06-03: 1 mL via INTRAMUSCULAR
  Filled 2015-06-03: qty 1

## 2015-06-03 NOTE — ED Provider Notes (Signed)
CSN: [id number]     Arrival date & time 06/03/15  [id number] History   First MD Initiated Contact with Patient 06/03/15 [phone]     Chief Complaint  Patient presents with  . Animal Bite     (Consider location/radiation/quality/duration/timing/severity/associated sxs/prior Treatment) Patient is a 39 y.o. male presenting with animal bite. The history is provided by the patient.  Animal Bite Associated symptoms: no fever and no rash    patient status post cat bite to left index finger yesterday from a stray cat. Patient tetanus status is not up-to-date. The animals immunization status is also unknown. The patient's tooth cut his left index finger was not a puncture wound. It did bleed. Rabies exposure is obviously of consideration. Patient states that it was of sore yesterday but feels better today.  Past Medical History  Diagnosis Date  . Renal disorder   . Hypertension    History reviewed. No pertinent past surgical history. History reviewed. No pertinent family history. Social History  Substance Use Topics  . Smoking status: Never Smoker   . Smokeless tobacco: None  . Alcohol Use: No    Review of Systems  Constitutional: Negative for fever.  HENT: Negative for congestion.   Eyes: Negative for redness.  Respiratory: Negative for shortness of breath.   Cardiovascular: Negative for chest pain.  Gastrointestinal: Negative for nausea, vomiting and abdominal pain.  Genitourinary: Negative for dysuria.  Musculoskeletal: Negative for back pain and neck pain.  Skin: Positive for wound. Negative for rash.  Neurological: Negative for headaches.  Hematological: Does not bruise/bleed easily.  Psychiatric/Behavioral: Negative for confusion.      Allergies  Review of patient's allergies indicates no known allergies.  Home Medications   Prior to Admission medications   Medication Sig Start Date End Date Taking? Authorizing Provider  diphenhydrAMINE (BENADRYL) 12.5 MG/5ML elixir Take 25  mg by mouth 4 (four) times daily as needed. For allergies    Historical Provider, MD  doxycycline (VIBRAMYCIN) 100 MG capsule Take 1 capsule (100 mg total) by mouth 2 (two) times daily. 06/03/15   Vanetta MuldersScott , MD  guaifenesin (ROBITUSSIN) 100 MG/5ML syrup Take 200 mg by mouth 3 (three) times daily as needed. For cough    Historical Provider, MD  HYDROcodone-acetaminophen (NORCO/VICODIN) 5-325 MG per tablet Take 1-2 tablets by mouth every 4 (four) hours as needed. 02/17/14   Elpidio AnisShari Upstill, PA-C  oxyCODONE-acetaminophen (PERCOCET) 5-325 MG per tablet Take 2 tablets by mouth every 4 (four) hours as needed. 05/22/13   Rolan BuccoMelanie Belfi, MD  penicillin v potassium (VEETID) 500 MG tablet Take 1 tablet (500 mg total) by mouth 3 (three) times daily. 02/17/14   Shari Upstill, PA-C   BP 137/87 mmHg  Pulse 73  Temp(Src) 98.9 F (37.2 C) (Oral)  Resp 16  Ht 5\' 6"  (1.676 m)  Wt 190 lb 4.1 oz (86.3 kg)  BMI 30.72 kg/m2  SpO2 97% Physical Exam  Constitutional: He is oriented to person, place, and time. He appears well-developed and well-nourished. No distress.  HENT:  Head: Normocephalic and atraumatic.  Mouth/Throat: Oropharynx is clear and moist.  Eyes: Conjunctivae and EOM are normal. Pupils are equal, round, and reactive to light.  Neck: Normal range of motion. Neck supple.  Cardiovascular: Normal rate, regular rhythm and normal heart sounds.   No murmur heard. Pulmonary/Chest: Effort normal and breath sounds normal. No respiratory distress.  Abdominal: Soft. Bowel sounds are normal. He exhibits no distension.  Musculoskeletal: Normal range of motion. He exhibits no edema or  tenderness.  Left hand left index finger middle phalanx with 1.5 cm laceration not deep. No swelling no significant tenderness Refill 2 seconds good range of motion of the finger. Sensation intact. No evidence of any puncture wound.  Neurological: He is alert and oriented to person, place, and time. No cranial nerve deficit. He  exhibits normal muscle tone. Coordination normal.  Skin: Skin is warm. No rash noted.  Nursing note and vitals reviewed.   ED Course  Procedures (including critical care time) Labs Review Labs Reviewed - No data to display  Imaging Review No results found. I have personally reviewed and evaluated these images and lab results as part of my medical decision-making.   EKG Interpretation None      MDM   Final diagnoses:  Cat bite of hand, left, initial encounter    Patient with a bite from a stray cat to his left index finger yesterday. Not a puncture wound but a scratch across the top from the animals tooth. Control blood. Patient's tetanus is unknown animal's immunization status also unknown.  No signs of any secondary infection to the left index finger today. Will start on doxycycline for prophylactic antibiotics. Patient started on.tetanus vaccination protocol. Patient received rabies immunoglobulin here today. And patient received first dose of the antibiotic as well. Patient will return here for any signs of infection was also return for completion of his rabies vaccination schedule.    Vanetta Mulders, MD 06/03/15 [phone]

## 2015-06-03 NOTE — ED Notes (Signed)
Went to feed a stray cat, was bitten on L lateral index finger (1- 5mm scratch 'from tooth' noted), unsure of his own Td status. Denies pain.

## 2015-06-03 NOTE — Discharge Instructions (Signed)
Animal Bite Animal bite wounds can get infected. It is important to get proper medical treatment. Ask your doctor if you need rabies treatment. HOME CARE  Wound Care  Follow instructions from your doctor about how to take care of your wound. Make sure you:  Wash your hands with soap and water before you change your bandage (dressing). If you cannot use soap and water, use hand sanitizer.  Change your bandage as told by your doctor.  Leave stitches (sutures), skin glue, or skin tape (adhesive) strips in place. They may need to stay in place for 2 weeks or longer. If tape strips get loose and curl up, you may trim the loose edges. Do not remove tape strips completely unless your doctor says it is okay.  Check your wound every day for signs of infection. Watch for:    Redness, swelling, or pain that gets worse.    Fluid, blood, or pus.  General Instructions  Take or apply over-the-counter and prescription medicines only as told by your doctor.   If you were prescribed an antibiotic, take or apply it as told by your doctor. Do not stop using the antibiotic even if your condition improves.   Keep the injured area raised (elevated) above the level of your heart while you are sitting or lying down.  If directed, apply ice to the injured area.    Put ice in a plastic bag.    Place a towel between your skin and the bag.    Leave the ice on for 20 minutes, 2-3 times per day.   Keep all follow-up visits as told by your doctor. This is important.  GET HELP IF:  You have redness, swelling, or pain that gets worse.   You have a general feeling of sickness (malaise).   You feel sick to your stomach (nauseous).  You throw up (vomit).   You have pain that does not get better.  GET HELP RIGHT AWAY IF:   You have a red streak going away from your wound.   You have fluid, blood, or pus coming from your wound.   You have a fever or chills.   You have trouble  moving your injured area.   You have numbness or tingling anywhere on your body.    This information is not intended to replace advice given to you by your health care provider. Make sure you discuss any questions you have with your health care provider.  Take the antibiotic as directed. Return for development of any signs of infection to your left hand. Return as scheduled for your rabies vaccinations.   Document Released: 06/30/2005 Document Revised: 03/21/2015 Document Reviewed: 11/15/2014 Elsevier Interactive Patient Education [employer]2016 [employer] Inc.

## 2016-01-30 DIAGNOSIS — E6609 Other obesity due to excess calories: Secondary | ICD-10-CM | POA: Insufficient documentation

## 2016-01-30 DIAGNOSIS — R801 Persistent proteinuria, unspecified: Secondary | ICD-10-CM | POA: Insufficient documentation

## 2016-11-06 ENCOUNTER — Ambulatory Visit: Payer: Self-pay

## 2017-05-22 DIAGNOSIS — N1832 Chronic kidney disease, stage 3b: Secondary | ICD-10-CM | POA: Insufficient documentation

## 2017-05-22 DIAGNOSIS — N183 Chronic kidney disease, stage 3 unspecified: Secondary | ICD-10-CM | POA: Insufficient documentation

## 2018-06-14 DIAGNOSIS — E559 Vitamin D deficiency, unspecified: Secondary | ICD-10-CM | POA: Insufficient documentation

## 2018-09-13 ENCOUNTER — Ambulatory Visit (INDEPENDENT_AMBULATORY_CARE_PROVIDER_SITE_OTHER): Payer: Self-pay | Admitting: Nurse Practitioner

## 2018-09-13 DIAGNOSIS — Z0000 Encounter for general adult medical examination without abnormal findings: Secondary | ICD-10-CM

## 2018-09-13 NOTE — Progress Notes (Signed)
EAR

## 2020-05-10 ENCOUNTER — Ambulatory Visit: Payer: Self-pay

## 2020-10-22 ENCOUNTER — Other Ambulatory Visit: Payer: Self-pay

## 2020-10-22 ENCOUNTER — Encounter: Payer: Self-pay | Admitting: Emergency Medicine

## 2020-10-22 ENCOUNTER — Ambulatory Visit
Admission: EM | Admit: 2020-10-22 | Discharge: 2020-10-22 | Disposition: A | Discharge status: Home / Self Care | Payer: Self-pay | Attending: Emergency Medicine | Admitting: Emergency Medicine

## 2020-10-22 DIAGNOSIS — J22 Unspecified acute lower respiratory infection: Secondary | ICD-10-CM

## 2020-10-22 MED ORDER — DM-GUAIFENESIN ER 30-600 MG PO TB12: 1.0000 | ORAL_TABLET | Freq: Two times a day (BID) | ORAL | 0 refills | Status: DC

## 2020-10-22 MED ORDER — DOXYCYCLINE HYCLATE 100 MG PO CAPS: 100.0000 mg | ORAL_CAPSULE | Freq: Two times a day (BID) | ORAL | 0 refills | Status: AC

## 2020-10-22 MED ORDER — BENZONATATE 200 MG PO CAPS: 200.0000 mg | ORAL_CAPSULE | Freq: Three times a day (TID) | ORAL | 0 refills | Status: AC | PRN

## 2020-10-22 MED ORDER — GUAIFENESIN-CODEINE 100-10 MG/5ML PO SOLN: 5.0000 mL | Freq: Every evening | ORAL | 0 refills | Status: DC | PRN

## 2020-10-22 NOTE — ED Provider Notes (Signed)
[name]    CSN: [id number] Arrival date & time: 10/22/20  1513      History   Chief Complaint Chief Complaint  Patient presents with  . Cough    HPI Andrew Garza is a 45 y.o. male history of hypertension, CKD, presenting today for evaluation of cough.  Reports cough and chills for approximately 1 week.  Reports cough, body aches, sore throat.  Denies known fevers.  Denies wheezing or shortness of breath or difficulty breathing.  Does report episode of coughing so hard causing him to become lightheaded.  HPI  Past Medical History:  Diagnosis Date  . Hypertension   . Renal disorder     There are no problems to display for this patient.   History reviewed. No pertinent surgical history.     Home Medications    Prior to Admission medications   Medication Sig Start Date End Date Taking? Authorizing Provider  benzonatate (TESSALON) 200 MG capsule Take 1 capsule (200 mg total) by mouth 3 (three) times daily as needed for up to 7 days for cough. 10/22/20 10/29/20 Yes ,  C, PA-C  dextromethorphan-guaiFENesin (MUCINEX DM) 30-600 MG 12hr tablet Take 1 tablet by mouth 2 (two) times daily. 10/22/20  Yes ,  C, PA-C  doxycycline (VIBRAMYCIN) 100 MG capsule Take 1 capsule (100 mg total) by mouth 2 (two) times daily for 7 days. 10/22/20 10/29/20 Yes ,  C, PA-C  guaiFENesin-codeine 100-10 MG/5ML syrup Take 5-10 mLs by mouth at bedtime as needed for cough. 10/22/20  Yes ,  C, PA-C  diphenhydrAMINE (BENADRYL) 12.5 MG/5ML elixir Take 25 mg by mouth 4 (four) times daily as needed. For allergies    [provider]    Family History History reviewed. No pertinent family history.  Social History Social History   Tobacco Use  . Smoking status: Never Smoker  Substance Use Topics  . Alcohol use: No  . Drug use: No     Allergies   Patient has no known allergies.   Review of Systems Review of Systems   Constitutional: Negative for activity change, appetite change, chills, fatigue and fever.  HENT: Positive for congestion, rhinorrhea, sinus pressure and sore throat. Negative for ear pain and trouble swallowing.   Eyes: Negative for discharge and redness.  Respiratory: Positive for cough. Negative for chest tightness and shortness of breath.   Cardiovascular: Negative for chest pain.  Gastrointestinal: Negative for abdominal pain, diarrhea, nausea and vomiting.  Musculoskeletal: Negative for myalgias.  Skin: Negative for rash.  Neurological: Negative for dizziness, light-headedness and headaches.     Physical Exam Triage Vital Signs ED Triage Vitals  Enc Vitals Group     BP 10/22/20 1609 (!) 149/91     Pulse Rate 10/22/20 1609 96     Resp 10/22/20 1609 18     Temp 10/22/20 1609 99.3 F (37.4 C)     Temp Source 10/22/20 1609 Oral     SpO2 10/22/20 1609 95 %     Weight --      Height --      Head Circumference --      Peak Flow --      Pain Score 10/22/20 1610 5     Pain Loc --      Pain Edu? --      Excl. in GC? --    No data found.  Updated Vital Signs BP (!) 149/91 (BP Location: Left Arm)   Pulse 96   Temp 99.3  F (37.4 C) (Oral)   Resp 18   SpO2 95%   Visual Acuity Right Eye Distance:   Left Eye Distance:   Bilateral Distance:    Right Eye Near:   Left Eye Near:    Bilateral Near:     Physical Exam Vitals and nursing note reviewed.  Constitutional:      Appearance: He is well-developed.     Comments: No acute distress  HENT:     Head: Normocephalic and atraumatic.     Ears:     Comments: Bilateral ears without tenderness to palpation of external auricle, tragus and mastoid, EAC's without erythema or swelling, TM's with good bony landmarks and cone of light. Non erythematous.     Nose: Nose normal.     Mouth/Throat:     Comments: Oral mucosa pink and moist, no tonsillar enlargement or exudate. Posterior pharynx patent and nonerythematous, no uvula  deviation or swelling. Normal phonation. Eyes:     Conjunctiva/sclera: Conjunctivae normal.  Cardiovascular:     Rate and Rhythm: Normal rate.  Pulmonary:     Effort: Pulmonary effort is normal. No respiratory distress.     Comments: Breathing comfortably at rest, CTABL, no wheezing, rales or other adventitious sounds auscultated Abdominal:     General: There is no distension.  Musculoskeletal:        General: Normal range of motion.     Cervical back: Neck supple.  Skin:    General: Skin is warm and dry.  Neurological:     Mental Status: He is alert and oriented to person, place, and time.      UC Treatments / Results  Labs (all labs ordered are listed, but only abnormal results are displayed) Labs Reviewed  NOVEL CORONAVIRUS, NAA    EKG   Radiology No results found.  Procedures Procedures (including critical care time)  Medications Ordered in UC Medications - No data to display  Initial Impression / Assessment and Plan / UC Course  I have reviewed the triage vital signs and the nursing notes.  Pertinent labs & imaging results that were available during my care of the patient were reviewed by me and considered in my medical decision making (see chart for details).     Covid test pending, symptoms greater than 1 week, opting to go ahead and place on doxycycline and recommending continued symptomatic and supportive care of cough and congestion, provided Tessalon and Mucinex DM for daytime cough, Robitussin with codeine at bedtime.   Discussed strict return precautions. Patient verbalized understanding and is agreeable with plan.  Final Clinical Impressions(s) / UC Diagnoses   Final diagnoses:  Lower respiratory infection (e.g., bronchitis, pneumonia, pneumonitis, pulmonitis)     Discharge Instructions     Covid test pending, monitor MyChart for results Begin doxycycline twice daily for 1 week Mucinex DM twice daily for congestion and cough Tessalon every  8 hours during the day for cough Robitussin with codeine for nighttime cough-will cause drowsiness, do not drive or work after taking Rest and fluids Honey and hot tea Follow-up if not improving or worsening    ED Prescriptions    Medication Sig Dispense Auth. Provider   doxycycline (VIBRAMYCIN) 100 MG capsule Take 1 capsule (100 mg total) by mouth 2 (two) times daily for 7 days. 14 capsule ,  C, PA-C   benzonatate (TESSALON) 200 MG capsule Take 1 capsule (200 mg total) by mouth 3 (three) times daily as needed for up to 7 days for cough. 28  capsule ,  C, PA-C   dextromethorphan-guaiFENesin (MUCINEX DM) 30-600 MG 12hr tablet Take 1 tablet by mouth 2 (two) times daily. 20 tablet ,  C, PA-C   guaiFENesin-codeine 100-10 MG/5ML syrup Take 5-10 mLs by mouth at bedtime as needed for cough. 120 mL , Sharpes C, PA-C     I have reviewed the PDMP during this encounter.   Lew Dawes, [place] 10/22/20 1717

## 2020-10-22 NOTE — ED Triage Notes (Signed)
Pt sts cough and congestion with chills x 1 week

## 2020-10-22 NOTE — Discharge Instructions (Addendum)
Covid test pending, monitor MyChart for results Begin doxycycline twice daily for 1 week Mucinex DM twice daily for congestion and cough Tessalon every 8 hours during the day for cough Robitussin with codeine for nighttime cough-will cause drowsiness, do not drive or work after taking Rest and fluids Honey and hot tea Follow-up if not improving or worsening

## 2020-10-23 LAB — SARS-COV-2, NAA 2 DAY TAT

## 2020-10-23 LAB — NOVEL CORONAVIRUS, NAA: SARS-CoV-2, NAA: NOT DETECTED

## 2020-10-29 ENCOUNTER — Encounter: Payer: Self-pay | Admitting: Emergency Medicine

## 2020-11-10 ENCOUNTER — Encounter: Payer: Self-pay | Admitting: Emergency Medicine

## 2020-11-10 ENCOUNTER — Other Ambulatory Visit: Payer: Self-pay

## 2020-11-10 ENCOUNTER — Ambulatory Visit (INDEPENDENT_AMBULATORY_CARE_PROVIDER_SITE_OTHER): Payer: Self-pay

## 2020-11-10 ENCOUNTER — Ambulatory Visit
Admission: EM | Admit: 2020-11-10 | Discharge: 2020-11-10 | Disposition: A | Discharge status: Home / Self Care | Payer: Self-pay | Attending: Emergency Medicine | Admitting: Emergency Medicine

## 2020-11-10 DIAGNOSIS — R0602 Shortness of breath: Secondary | ICD-10-CM

## 2020-11-10 DIAGNOSIS — R059 Cough, unspecified: Secondary | ICD-10-CM

## 2020-11-10 MED ORDER — BENZONATATE 200 MG PO CAPS: 200.0000 mg | ORAL_CAPSULE | Freq: Three times a day (TID) | ORAL | 0 refills | Status: AC | PRN

## 2020-11-10 MED ORDER — PREDNISONE 50 MG PO TABS: 50.0000 mg | ORAL_TABLET | Freq: Every day | ORAL | 0 refills | Status: AC

## 2020-11-10 MED ORDER — GUAIFENESIN-CODEINE 100-10 MG/5ML PO SOLN: 5.0000 mL | Freq: Every evening | ORAL | 0 refills | Status: DC | PRN

## 2020-11-10 NOTE — Discharge Instructions (Signed)
Chest x-ray normal, no signs of pneumonia Begin prednisone course x5 days-take with food and earlier in the day if possible Refill Tessalon and Robitussin with codeine Rest and drink plenty of fluids Follow-up if not improving or worsening

## 2020-11-10 NOTE — ED Provider Notes (Signed)
[name]    CSN: [id number] Arrival date & time: 11/10/20  8786      History   Chief Complaint Chief Complaint  Patient presents with  . Cough    HPI Andrew Garza is a 45 y.o. male history of hypertension presenting today for evaluation of a lingering cough.  Patient was seen approximately 3 weeks ago and started on doxycycline, Tessalon, Mucinex and Robitussin with codeine at bedtime.  Reports sinus symptoms have improved, but cough has persisted.  Cough is dry nonproductive.  Occasional episodes of posttussive emesis and occasional sensations of lightheadedness with coughing spells.  Does report some occasional wheezing.  HPI  Past Medical History:  Diagnosis Date  . Hypertension   . Renal disorder     There are no problems to display for this patient.   History reviewed. No pertinent surgical history.     Home Medications    Prior to Admission medications   Medication Sig Start Date End Date Taking? Authorizing Provider  benzonatate (TESSALON) 200 MG capsule Take 1 capsule (200 mg total) by mouth 3 (three) times daily as needed for up to 7 days for cough. 11/10/20 11/17/20 Yes ,  C, PA-C  predniSONE (DELTASONE) 50 MG tablet Take 1 tablet (50 mg total) by mouth daily with breakfast for 5 days. 11/10/20 11/15/20 Yes ,  C, PA-C  diphenhydrAMINE (BENADRYL) 12.5 MG/5ML elixir Take 25 mg by mouth 4 (four) times daily as needed. For allergies    [provider]  guaiFENesin-codeine 100-10 MG/5ML syrup Take 5-10 mLs by mouth at bedtime as needed for cough. 11/10/20   , Junius Creamer, PA-C    Family History History reviewed. No pertinent family history.  Social History Social History   Tobacco Use  . Smoking status: Never Smoker  Substance Use Topics  . Alcohol use: No  . Drug use: No     Allergies   Patient has no known allergies.   Review of Systems Review of Systems  Constitutional: Negative for activity change,  appetite change, chills, fatigue and fever.  HENT: Negative for congestion, ear pain, rhinorrhea, sinus pressure, sore throat and trouble swallowing.   Eyes: Negative for discharge and redness.  Respiratory: Positive for cough. Negative for chest tightness and shortness of breath.   Cardiovascular: Negative for chest pain.  Gastrointestinal: Negative for abdominal pain, diarrhea, nausea and vomiting.  Musculoskeletal: Negative for myalgias.  Skin: Negative for rash.  Neurological: Negative for dizziness, light-headedness and headaches.     Physical Exam Triage Vital Signs ED Triage Vitals  Enc Vitals Group     BP      Pulse      Resp      Temp      Temp src      SpO2      Weight      Height      Head Circumference      Peak Flow      Pain Score      Pain Loc      Pain Edu?      Excl. in GC?    No data found.  Updated Vital Signs BP (!) 143/90 (BP Location: Left Arm)   Pulse (!) 102   Temp 99.3 F (37.4 C) (Oral)   Resp 18   SpO2 96%   Visual Acuity Right Eye Distance:   Left Eye Distance:   Bilateral Distance:    Right Eye Near:   Left Eye Near:  Bilateral Near:     Physical Exam Vitals and nursing note reviewed.  Constitutional:      Appearance: He is well-developed.     Comments: No acute distress  HENT:     Head: Normocephalic and atraumatic.     Ears:     Comments: Bilateral ears without tenderness to palpation of external auricle, tragus and mastoid, EAC's without erythema or swelling, TM's with good bony landmarks and cone of light. Non erythematous.     Nose: Nose normal.     Mouth/Throat:     Comments: Oral mucosa pink and moist, no tonsillar enlargement or exudate. Posterior pharynx patent and nonerythematous, no uvula deviation or swelling. Normal phonation. Eyes:     Conjunctiva/sclera: Conjunctivae normal.  Cardiovascular:     Rate and Rhythm: Normal rate.  Pulmonary:     Effort: Pulmonary effort is normal. No respiratory distress.      Comments: Breathing comfortably at rest, CTABL, no wheezing, rales or other adventitious sounds auscultated Abdominal:     General: There is no distension.  Musculoskeletal:        General: Normal range of motion.     Cervical back: Neck supple.  Skin:    General: Skin is warm and dry.  Neurological:     Mental Status: He is alert and oriented to person, place, and time.      UC Treatments / Results  Labs (all labs ordered are listed, but only abnormal results are displayed) Labs Reviewed - No data to display  EKG   Radiology DG Chest 2 View  Result Date: 11/10/2020 CLINICAL DATA:  Shortness of breath and cough for 2 weeks. EXAM: CHEST - 2 VIEW COMPARISON:  12/10/2010 chest radiograph FINDINGS: The cardiomediastinal silhouette is unremarkable. There is no evidence of focal airspace disease, pulmonary edema, suspicious pulmonary nodule/mass, pleural effusion, or pneumothorax. No acute bony abnormalities are identified. IMPRESSION: No active cardiopulmonary disease. Electronically Signed   By: Harmon Pier M.D.   On: 11/10/2020 10:57    Procedures Procedures (including critical care time)  Medications Ordered in UC Medications - No data to display  Initial Impression / Assessment and Plan / UC Course  I have reviewed the triage vital signs and the nursing notes.  Pertinent labs & imaging results that were available during my care of the patient were reviewed by me and considered in my medical decision making (see chart for details).     Chest x-ray negative, suspect likely postviral/persistent cough after recent URI.  Recently on course of Doxy, deferring further antibiotics.  Placing on prednisone course x5 days and feeling Tessalon and Robitussin with codeine for bedtime as he is previously helped him.  Discussed strict return precautions. Patient verbalized understanding and is agreeable with plan.  Final Clinical Impressions(s) / UC Diagnoses   Final diagnoses:   Cough     Discharge Instructions     Chest x-ray normal, no signs of pneumonia Begin prednisone course x5 days-take with food and earlier in the day if possible Refill Tessalon and Robitussin with codeine Rest and drink plenty of fluids Follow-up if not improving or worsening    ED Prescriptions    Medication Sig Dispense Auth. Provider   guaiFENesin-codeine 100-10 MG/5ML syrup Take 5-10 mLs by mouth at bedtime as needed for cough. 120 mL ,  C, PA-C   benzonatate (TESSALON) 200 MG capsule Take 1 capsule (200 mg total) by mouth 3 (three) times daily as needed for up to 7 days for cough.  28 capsule ,  C, PA-C   predniSONE (DELTASONE) 50 MG tablet Take 1 tablet (50 mg total) by mouth daily with breakfast for 5 days. 5 tablet , Buda C, PA-C     PDMP not reviewed this encounter.   Lew Dawes, PA-C 11/10/20 1108

## 2020-11-10 NOTE — ED Triage Notes (Signed)
Pt here for cough x 1 week; pt seen recently for same

## 2021-12-30 DIAGNOSIS — N1832 Chronic kidney disease, stage 3b: Secondary | ICD-10-CM | POA: Insufficient documentation

## 2023-02-16 ENCOUNTER — Emergency Department (HOSPITAL_BASED_OUTPATIENT_CLINIC_OR_DEPARTMENT_OTHER)
Admission: EM | Admit: 2023-02-16 | Discharge: 2023-02-16 | Disposition: A | Discharge status: Home / Self Care | Payer: Self-pay | Attending: Emergency Medicine | Admitting: Emergency Medicine

## 2023-02-16 ENCOUNTER — Encounter (HOSPITAL_BASED_OUTPATIENT_CLINIC_OR_DEPARTMENT_OTHER): Payer: Self-pay

## 2023-02-16 ENCOUNTER — Other Ambulatory Visit: Payer: Self-pay

## 2023-02-16 ENCOUNTER — Ambulatory Visit
Admission: EM | Admit: 2023-02-16 | Discharge: 2023-02-16 | Disposition: A | Discharge status: Home / Self Care | Payer: Self-pay

## 2023-02-16 ENCOUNTER — Encounter: Payer: Self-pay | Admitting: *Deleted

## 2023-02-16 ENCOUNTER — Telehealth (HOSPITAL_BASED_OUTPATIENT_CLINIC_OR_DEPARTMENT_OTHER): Payer: Self-pay | Admitting: Emergency Medicine

## 2023-02-16 ENCOUNTER — Emergency Department (HOSPITAL_BASED_OUTPATIENT_CLINIC_OR_DEPARTMENT_OTHER): Payer: Self-pay

## 2023-02-16 DIAGNOSIS — R55 Syncope and collapse: Secondary | ICD-10-CM

## 2023-02-16 DIAGNOSIS — N189 Chronic kidney disease, unspecified: Secondary | ICD-10-CM | POA: Insufficient documentation

## 2023-02-16 DIAGNOSIS — R112 Nausea with vomiting, unspecified: Secondary | ICD-10-CM | POA: Insufficient documentation

## 2023-02-16 DIAGNOSIS — I129 Hypertensive chronic kidney disease with stage 1 through stage 4 chronic kidney disease, or unspecified chronic kidney disease: Secondary | ICD-10-CM | POA: Insufficient documentation

## 2023-02-16 DIAGNOSIS — M542 Cervicalgia: Secondary | ICD-10-CM

## 2023-02-16 DIAGNOSIS — W1789XA Other fall from one level to another, initial encounter: Secondary | ICD-10-CM

## 2023-02-16 LAB — URINALYSIS, ROUTINE W REFLEX MICROSCOPIC
Bacteria, UA: NONE SEEN
Bilirubin Urine: NEGATIVE
Glucose, UA: NEGATIVE mg/dL
Ketones, ur: NEGATIVE mg/dL
Leukocytes,Ua: NEGATIVE
Nitrite: NEGATIVE
Protein, ur: >300 mg/dL — AB
Specific Gravity, Urine: 1.01 (ref 1.005–1.030)
pH: 6.5 (ref 5.0–8.0)

## 2023-02-16 LAB — CBG MONITORING, ED: Glucose-Capillary: 119 mg/dL — ABNORMAL HIGH (ref 70–99)

## 2023-02-16 LAB — CBC
HCT: 41.4 % (ref 39.0–52.0)
Hemoglobin: 14.2 g/dL (ref 13.0–17.0)
MCH: 31.5 pg (ref 26.0–34.0)
MCHC: 34.3 g/dL (ref 30.0–36.0)
MCV: 91.8 fL (ref 80.0–100.0)
Platelets: 248 10*3/uL (ref 150–400)
RBC: 4.51 MIL/uL (ref 4.22–5.81)
RDW: 12.9 % (ref 11.5–15.5)
WBC: 6.1 10*3/uL (ref 4.0–10.5)
nRBC: 0 % (ref 0.0–0.2)

## 2023-02-16 LAB — BASIC METABOLIC PANEL
Anion gap: 6 (ref 5–15)
BUN: 17 mg/dL (ref 6–20)
CO2: 28 mmol/L (ref 22–32)
Calcium: 9.4 mg/dL (ref 8.9–10.3)
Chloride: 106 mmol/L (ref 98–111)
Creatinine, Ser: 1.75 mg/dL — ABNORMAL HIGH (ref 0.61–1.24)
GFR, Estimated: 48 mL/min — ABNORMAL LOW (ref >60)
Glucose, Bld: 105 mg/dL — ABNORMAL HIGH (ref 70–99)
Potassium: 4.1 mmol/L (ref 3.5–5.1)
Sodium: 140 mmol/L (ref 135–145)

## 2023-02-16 LAB — TROPONIN I (HIGH SENSITIVITY): Troponin I (High Sensitivity): 8 ng/L (ref <18)

## 2023-02-16 MED ORDER — LIDOCAINE 5 % EX PTCH: 1.0000 | MEDICATED_PATCH | CUTANEOUS | 0 refills | Status: DC

## 2023-02-16 MED ORDER — METHOCARBAMOL 500 MG PO TABS: 500.0000 mg | ORAL_TABLET | Freq: Three times a day (TID) | ORAL | 0 refills | Status: DC | PRN

## 2023-02-16 NOTE — ED Notes (Signed)
Pt c/o falling and hitting his head. Pt unaware of events leading up to the incident. Neuro status intact. A/ox4. GCS 15. No abnormalities on head.

## 2023-02-16 NOTE — ED Triage Notes (Signed)
Pt to ED from UC c/o syncopal episode that occurred Saturday night, unclear events that occurred, pt reports he might have fell out of truck, when he woke up was laying next to truck. Unwitnessed. Reports at the time had food poisoning and why he was weak. Pt reports hit his head. Pt now c/o back pain. Ambulatory. Not on blood thinners.

## 2023-02-16 NOTE — Discharge Instructions (Addendum)
Please go to the emergency department as soon as you leave urgent care for further evaluation and management. ?

## 2023-02-16 NOTE — ED Provider Notes (Signed)
[facility] AT [facility] Provider Note  CSN: [id number] Arrival date & time: 02/16/23 0935  Chief Complaint(s) Loss of Consciousness  HPI Andrew Garza is a 47 y.o. male history of chronic kidney disease, hypertension presenting to the emergency department with syncope.  Patient reports 2 days ago had a syncopal event.  Reports that he thinks he had food poisoning, was very nauseous with vomiting, sweaty, felt ill.  When he went to go vomit, he syncopized for an unknown period of time.  Thinks he hit his head and neck.  No chest or abdominal pain.  No fevers or chills.  Nausea, vomiting has all resolved.  No dizziness, vertigo, numbness or tingling, weakness.  He had persistent neck pain afterwards so went to urgent care today to get this evaluated.   Past Medical History Past Medical History:  Diagnosis Date   Hypertension    Renal disorder    There are no problems to display for this patient.  Home Medication(s) Prior to Admission medications   Medication Sig Start Date End Date Taking? Authorizing Provider  lidocaine (LIDODERM) 5 % Place 1 patch onto the skin daily. Remove & Discard patch within 12 hours or as directed by MD 02/16/23  Yes Lonell Grandchild, MD  methocarbamol (ROBAXIN) 500 MG tablet Take 1 tablet (500 mg total) by mouth every 8 (eight) hours as needed for muscle spasms. 02/16/23  Yes Lonell Grandchild, MD  diphenhydrAMINE (BENADRYL) 12.5 MG/5ML elixir Take 25 mg by mouth 4 (four) times daily as needed. For allergies    [provider]  guaiFENesin-codeine 100-10 MG/5ML syrup Take 5-10 mLs by mouth at bedtime as needed for cough. 11/10/20   Wieters, Junius Creamer, PA-C                                                                                                                                    Past Surgical History History reviewed. No pertinent surgical history. Family History History reviewed. No pertinent family history.  Social  History Social History   Tobacco Use   Smoking status: Never  Substance Use Topics   Alcohol use: No   Drug use: No   Allergies Patient has no known allergies.  Review of Systems Review of Systems  All other systems reviewed and are negative.   Physical Exam Vital Signs  I have reviewed the triage vital signs BP 121/81   Pulse 77   Temp 98 F (36.7 C) (Oral)   Resp 18   Ht 5\' 6"  (1.676 m)   Wt 92.5 kg   SpO2 97%   BMI 32.93 kg/m  Physical Exam Vitals and nursing note reviewed.  Constitutional:      General: He is not in acute distress.    Appearance: Normal appearance. He is obese.  HENT:     Head: Normocephalic and atraumatic.     Mouth/Throat:     Mouth:  Mucous membranes are moist.  Eyes:     Conjunctiva/sclera: Conjunctivae normal.  Neck:     Comments: Mild midline cervical tenderness without step-off.  No T or L-spine tenderness. Cardiovascular:     Rate and Rhythm: Normal rate and regular rhythm.     Pulses:          Radial pulses are 2+ on the right side and 2+ on the left side.  Pulmonary:     Effort: Pulmonary effort is normal. No respiratory distress.     Breath sounds: Normal breath sounds.  Abdominal:     General: Abdomen is flat.     Palpations: Abdomen is soft.     Tenderness: There is no abdominal tenderness.  Musculoskeletal:     Right lower leg: No edema.     Left lower leg: No edema.  Skin:    General: Skin is warm and dry.     Capillary Refill: Capillary refill takes less than 2 seconds.  Neurological:     Mental Status: He is alert and oriented to person, place, and time. Mental status is at baseline.  Psychiatric:        Mood and Affect: Mood normal.        Behavior: Behavior normal.     ED Results and Treatments Labs (all labs ordered are listed, but only abnormal results are displayed) Labs Reviewed  BASIC METABOLIC PANEL - Abnormal; Notable for the following components:      Result Value   Glucose, Bld 105 (*)     Creatinine, Ser 1.75 (*)    GFR, Estimated 48 (*)    All other components within normal limits  URINALYSIS, ROUTINE W REFLEX MICROSCOPIC - Abnormal; Notable for the following components:   Hgb urine dipstick SMALL (*)    Protein, ur >300 (*)    All other components within normal limits  CBG MONITORING, ED - Abnormal; Notable for the following components:   Glucose-Capillary 119 (*)    All other components within normal limits  CBC  TROPONIN I (HIGH SENSITIVITY)                                                                                                                          Radiology DG Chest Port 1 View  Result Date: 02/16/2023 CLINICAL DATA:  Syncope EXAM: PORTABLE CHEST 1 VIEW COMPARISON:  11/10/2020 FINDINGS: The heart size and mediastinal contours are within normal limits. Slightly low lung volumes. No focal airspace consolidation, pleural effusion, or pneumothorax. The visualized skeletal structures are unremarkable. IMPRESSION: No active disease. Electronically Signed   By: Duanne Guess D.O.   On: 02/16/2023 11:50   CT Head Wo Contrast  Result Date: 02/16/2023 CLINICAL DATA:  Head trauma, repeat vomiting (Age 41-64y) Syncope/presyncope, cerebrovascular cause suspected; Neck trauma, midline tenderness (Age 41-64y). EXAM: CT HEAD WITHOUT CONTRAST CT CERVICAL SPINE WITHOUT CONTRAST TECHNIQUE: Multidetector CT imaging of the head and cervical spine was performed following the standard protocol without intravenous  contrast. Multiplanar CT image reconstructions of the cervical spine were also generated. RADIATION DOSE REDUCTION: This exam was performed according to the departmental dose-optimization program which includes automated exposure control, adjustment of the mA and/or kV according to patient size and/or use of iterative reconstruction technique. COMPARISON:  None Available. FINDINGS: CT HEAD FINDINGS Brain: No acute intracranial hemorrhage. Mild chronic small-vessel disease.  Cortical gray-white differentiation is preserved. No hydrocephalus or extra-axial collection. No mass effect or midline shift. Vascular: No hyperdense vessel or unexpected calcification. Skull: No calvarial fracture or suspicious bone lesion. Skull base is unremarkable. Sinuses/Orbits: No acute finding. Other: None. CT CERVICAL SPINE FINDINGS Alignment: Normal. Skull base and vertebrae: No acute fracture. Normal craniocervical junction. No suspicious bone lesions. Soft tissues and spinal canal: No prevertebral fluid or swelling. No visible canal hematoma. Disc levels: Mild cervical spondylosis without high-grade spinal canal stenosis. Upper chest: Unremarkable. Other: None. IMPRESSION: 1. No acute intracranial abnormality. Mild chronic small-vessel disease. 2. No acute cervical spine fracture or traumatic listhesis. Electronically Signed   By: Orvan Falconer M.D.   On: 02/16/2023 11:34   CT Cervical Spine Wo Contrast  Result Date: 02/16/2023 CLINICAL DATA:  Head trauma, repeat vomiting (Age 32-64y) Syncope/presyncope, cerebrovascular cause suspected; Neck trauma, midline tenderness (Age 64-64y). EXAM: CT HEAD WITHOUT CONTRAST CT CERVICAL SPINE WITHOUT CONTRAST TECHNIQUE: Multidetector CT imaging of the head and cervical spine was performed following the standard protocol without intravenous contrast. Multiplanar CT image reconstructions of the cervical spine were also generated. RADIATION DOSE REDUCTION: This exam was performed according to the departmental dose-optimization program which includes automated exposure control, adjustment of the mA and/or kV according to patient size and/or use of iterative reconstruction technique. COMPARISON:  None Available. FINDINGS: CT HEAD FINDINGS Brain: No acute intracranial hemorrhage. Mild chronic small-vessel disease. Cortical gray-white differentiation is preserved. No hydrocephalus or extra-axial collection. No mass effect or midline shift. Vascular: No hyperdense  vessel or unexpected calcification. Skull: No calvarial fracture or suspicious bone lesion. Skull base is unremarkable. Sinuses/Orbits: No acute finding. Other: None. CT CERVICAL SPINE FINDINGS Alignment: Normal. Skull base and vertebrae: No acute fracture. Normal craniocervical junction. No suspicious bone lesions. Soft tissues and spinal canal: No prevertebral fluid or swelling. No visible canal hematoma. Disc levels: Mild cervical spondylosis without high-grade spinal canal stenosis. Upper chest: Unremarkable. Other: None. IMPRESSION: 1. No acute intracranial abnormality. Mild chronic small-vessel disease. 2. No acute cervical spine fracture or traumatic listhesis. Electronically Signed   By: Orvan Falconer M.D.   On: 02/16/2023 11:34    Pertinent labs & imaging results that were available during my care of the patient were reviewed by me and considered in my medical decision making (see MDM for details).  Medications Ordered in ED Medications - No data to display  Procedures Procedures  (including critical care time)  Medical Decision Making / ED Course   MDM:  47 year old male presenting to the emergency department with syncope.  Suspect syncope was most likely in the setting of his illness with nausea and vomiting, probable dehydration.  Less likely cardiac but given symptoms of nausea and vomiting and diaphoresis, will check single troponin.  EKG is without acute ST or T wave abnormality, normal sinus rhythm..  Will check basic labs..  He reports the primary reason he went to urgent care was the neck pain from his syncopal event.  They sent him here for imaging.  Will proceed with imaging given head injury and midline tenderness.  Clinical Course as of 02/16/23 1224  Mon Feb 16, 2023  1223 Workup overall reassuring.  No neck fracture or head injury.  Chest  x-ray clear.  Suspect syncope was in the setting of vomiting, possibly dehydration. No further syncopal episodes since 2 days ago in setting of vomiting. Will discharge patient to home. All questions answered. Patient comfortable with plan of discharge. Return precautions discussed with patient and specified on the after visit summary.  [WS]    Clinical Course User Index [WS] Lonell Grandchild, MD     Additional history obtained: -External records from outside source obtained and reviewed including: Chart review including previous notes, labs, imaging, consultation notes including UC note   Lab Tests: -I ordered, reviewed, and interpreted labs.   The pertinent results include:   Labs Reviewed  BASIC METABOLIC PANEL - Abnormal; Notable for the following components:      Result Value   Glucose, Bld 105 (*)    Creatinine, Ser 1.75 (*)    GFR, Estimated 48 (*)    All other components within normal limits  URINALYSIS, ROUTINE W REFLEX MICROSCOPIC - Abnormal; Notable for the following components:   Hgb urine dipstick SMALL (*)    Protein, ur >300 (*)    All other components within normal limits  CBG MONITORING, ED - Abnormal; Notable for the following components:   Glucose-Capillary 119 (*)    All other components within normal limits  CBC  TROPONIN I (HIGH SENSITIVITY)    Notable for stable CKD, proteinuria   EKG   EKG Interpretation Date/Time:  Monday February 16 2023 09:49:34 EDT Ventricular Rate:  75 PR Interval:  140 QRS Duration:  85 QT Interval:  427 QTC Calculation: 477 R Axis:   26  Text Interpretation: Sinus rhythm Borderline T wave abnormalities Borderline prolonged QT interval Confirmed by Alvino Blood ([id number]) on 02/16/2023 10:17:14 AM         Imaging Studies ordered: I ordered imaging studies including CT head, CT neck, CXR On my interpretation imaging demonstrates no acute process I independently visualized and interpreted imaging. I agree with the  radiologist interpretation   Medicines ordered and prescription drug management: Meds ordered this encounter  Medications   methocarbamol (ROBAXIN) 500 MG tablet    Sig: Take 1 tablet (500 mg total) by mouth every 8 (eight) hours as needed for muscle spasms.    Dispense:  20 tablet    Refill:  0   lidocaine (LIDODERM) 5 %    Sig: Place 1 patch onto the skin daily. Remove & Discard patch within 12 hours or as directed by MD    Dispense:  15 patch    Refill:  0    -I have reviewed the patients home medicines and have made adjustments as needed  Cardiac Monitoring: The  patient was maintained on a cardiac monitor.  I personally viewed and interpreted the cardiac monitored which showed an underlying rhythm of: NSR  Social Determinants of Health:  Diagnosis or treatment significantly limited by social determinants of health: obesity    Co morbidities that complicate the patient evaluation  Past Medical History:  Diagnosis Date   Hypertension    Renal disorder       Dispostion: Disposition decision including need for hospitalization was considered, and patient discharged from emergency department.    Final Clinical Impression(s) / ED Diagnoses Final diagnoses:  Neck pain  Syncope, unspecified syncope type     This chart was dictated using voice recognition software.  Despite best efforts to proofread,  errors can occur which can change the documentation meaning.    Lonell Grandchild, MD 02/16/23 1224
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
## 2023-02-16 NOTE — ED Notes (Signed)
ED Provider at bedside. 

## 2023-02-16 NOTE — ED Notes (Signed)
Pt given discharge instructions and reviewed prescriptions. Opportunities given for questions. Pt verbalizes understanding. PIV removed x1. , R, RN 

## 2023-02-16 NOTE — Telephone Encounter (Signed)
Created in error
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
## 2023-02-16 NOTE — Discharge Instructions (Addendum)
We evaluated you for your neck pain after your fainting episode.   Your testing was reassuring.  We did not see any dangerous injuries.  Your cardiac tests, kidney tests, EKG, and CT scan were normal.  I think the most likely cause of your fainting was dehydration and vomiting.  Please follow-up closely with your primary doctor.   Please take at 1000 mg of Tylenol every 6 hours as needed for your neck pain.  I have also prescribed you a muscle relaxer and lidocaine patches.  You can take these as prescribed as needed.  If any new symptoms such as recurrent fainting, chest pain, difficulty breathing, leg swelling, nausea or vomiting, or any other concerning symptoms, please return to the emergency department.

## 2023-09-16 ENCOUNTER — Ambulatory Visit (INDEPENDENT_AMBULATORY_CARE_PROVIDER_SITE_OTHER): Payer: Self-pay | Admitting: Family Medicine

## 2023-09-16 ENCOUNTER — Encounter: Payer: Self-pay | Admitting: Family Medicine

## 2023-09-16 VITALS — BP 144/84 | HR 82 | Temp 97.5°F | Ht 65.0 in | Wt 222.0 lb

## 2023-09-16 DIAGNOSIS — E66812 Obesity, class 2: Secondary | ICD-10-CM

## 2023-09-16 DIAGNOSIS — E6609 Other obesity due to excess calories: Secondary | ICD-10-CM

## 2023-09-16 DIAGNOSIS — M79671 Pain in right foot: Secondary | ICD-10-CM

## 2023-09-16 DIAGNOSIS — E559 Vitamin D deficiency, unspecified: Secondary | ICD-10-CM

## 2023-09-16 DIAGNOSIS — M109 Gout, unspecified: Secondary | ICD-10-CM | POA: Insufficient documentation

## 2023-09-16 DIAGNOSIS — Z6836 Body mass index (BMI) 36.0-36.9, adult: Secondary | ICD-10-CM

## 2023-09-16 DIAGNOSIS — N1832 Chronic kidney disease, stage 3b: Secondary | ICD-10-CM

## 2023-09-16 DIAGNOSIS — N183 Chronic kidney disease, stage 3 unspecified: Secondary | ICD-10-CM

## 2023-09-16 DIAGNOSIS — R801 Persistent proteinuria, unspecified: Secondary | ICD-10-CM

## 2023-09-16 DIAGNOSIS — I129 Hypertensive chronic kidney disease with stage 1 through stage 4 chronic kidney disease, or unspecified chronic kidney disease: Secondary | ICD-10-CM

## 2023-09-16 MED ORDER — AMLODIPINE BESYLATE 5 MG PO TABS
5.0000 mg | ORAL_TABLET | Freq: Every day | ORAL | 0 refills | Status: DC
Start: 2023-09-16 — End: 2023-11-20

## 2023-09-16 MED ORDER — AMLODIPINE BESYLATE 5 MG PO TABS: 5.0000 mg | ORAL_TABLET | Freq: Every day | ORAL | 0 refills | Status: DC

## 2023-09-16 NOTE — Progress Notes (Signed)
 Assessment/Plan:      Hypertension Hypertension with current blood pressure of 144/86 mmHg. Home readings range from 140s to 160s/80s. On ramipril 10 mg. Considering proteinuria and CKD stage 3 with GFR 44, optimizing blood pressure is crucial to preserve kidney function. Amlodipine is recommended for its renal-sparing effects. 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 Managed by nephrologist Dr. Glori Luis. Current management includes ramipril for blood pressure control and renal protection.  - Continue management with nephrologist - Monitor kidney function and proteinuria  Foot Pain Intermittent right foot pain, possibly related to a bunion, severe enough to impair ambulation. No prior podiatric evaluation. Concern about over-the-counter analgesics affecting renal health. - Refer to podiatrist for evaluation and management  Vitamin D Deficiency Low vitamin D levels noted in December. Currently on 3000 IU daily. Recheck levels due to potential CKD impact on vitamin D metabolism. - Continue vitamin D 3000 IU daily - Recheck vitamin D levels at next visit  General Health Maintenance Discussion on prostate cancer screening and need for updated cholesterol and diabetes screening. Last cholesterol check in 2016 showed elevated levels. No recent A1c or fasting glucose available. - Discuss prostate cancer screening at next visit - Schedule fasting labs for cholesterol, A1c, and glucose - Perform physical exam at next visit  Follow-up Reassess blood pressure control, vitamin D levels, and perform comprehensive fasting labs. - Schedule follow-up in one month for physical exam and fasting labs - Recheck blood pressure, vitamin D, kidney function, cholesterol, A1c, glucose, liver function, thyroid, and  blood counts at next visit        Medications Discontinued During This Encounter  Medication Reason   methocarbamol (ROBAXIN) 500 MG tablet    diphenhydrAMINE (BENADRYL) 12.5 MG/5ML elixir    guaiFENesin-codeine 100-10 MG/5ML syrup    lidocaine (LIDODERM) 5 %    losartan (COZAAR) 50 MG tablet    amLODipine (NORVASC) 5 MG tablet     Return in about 1 month (around 10/17/2023) for physical (fasting labs).    Subjective:   Encounter date: 09/16/2023  Andrew Garza is a 48 y.o. male who has Benign hypertension with CKD (chronic kidney disease) stage III (HCC); Stage 3b chronic kidney disease (HCC); Obesity due to excess calories without serious comorbidity; Persistent proteinuria, unspecified; Vitamin D deficiency; and Gout on their problem list..   He  has a past medical history of Hypertension and Renal disorder.Marland Kitchen   He presents with chief complaint of Establish Care (Prostate check for cancer and bp concern. Patient would like to discuss colonoscopy options ) .  Discussed the use of AI scribe software for clinical note transcription with the patient, who gave verbal consent to proceed.  History of Present Illness   Andrew Garza is a 48 year old male with hypertension and kidney dysfunction who presents for establishing care and concerns about prostate cancer screening and foot pain.  He has concerns about prostate cancer screening. No specific prostate cancer screening was performed during this visit, but there was a discussion about general health maintenance and the need for future screenings.  He experiences intermittent foot pain, described as similar to a bunion, located on the small toe of his right foot. The pain is irritating and sometimes severe enough to impair walking. He recalls a previous prescription for a medication that  helped alleviate the pain, but he only takes it when the pain is severe. No prior podiatry evaluation has been conducted.  He has a history of  hypertension, currently managed with ramipril 10 mg. He monitors his blood pressure at home, with readings ranging from 140s to 160s over 80s to 107. He believes he is still taking ramipril and has not tried other blood pressure medications.  He has kidney dysfunction due to nephrosclerosis, with significant proteinuria. He is under the care of a nephrologist, whom he sees every six months. His current management includes ramipril for blood pressure control to preserve kidney function.  He reports a history of low vitamin D levels, for which he takes over-the-counter vitamin D supplements, currently at 3000 units daily. He started this regimen in December after being advised to take vitamin D for low levels. He has adjusted the dosage based on previous recommendations and current levels.  He mentions a past cholesterol check in 2016, which showed elevated levels, but no recent checks have been done. He also notes a history of elevated uric acid but denies a diagnosis of gout, although he has experienced foot pain that he initially thought might be gout-related. He has been informed of high sugar levels in the past, which he has managed to lower.  No chest pain or shortness of breath.       Review of Systems  All other systems reviewed and are negative.   No past surgical history on file.  Outpatient Medications Prior to Visit  Medication Sig Dispense Refill   cholecalciferol (VITAMIN D3) 25 MCG (1000 UNIT) tablet Take 1,000 Units by mouth daily.     ramipril (ALTACE) 10 MG capsule Take 10 mg by mouth daily.     losartan (COZAAR) 50 MG tablet Take 1 tablet by mouth daily.     diphenhydrAMINE (BENADRYL) 12.5 MG/5ML elixir Take 25 mg by mouth 4 (four) times daily as needed. For allergies (Patient not taking: Reported on 09/16/2023)     guaiFENesin-codeine 100-10 MG/5ML syrup Take 5-10 mLs by mouth at bedtime as needed for cough. (Patient not taking: Reported on 09/16/2023) 120 mL 0   lidocaine  (LIDODERM) 5 % Place 1 patch onto the skin daily. Remove & Discard patch within 12 hours or as directed by MD (Patient not taking: Reported on 09/16/2023) 15 patch 0   methocarbamol (ROBAXIN) 500 MG tablet Take 1 tablet (500 mg total) by mouth every 8 (eight) hours as needed for muscle spasms. (Patient not taking: Reported on 09/16/2023) 20 tablet 0   No facility-administered medications prior to visit.    Family History  Problem Relation Age of Onset   Cancer Father    Diabetes Father    Heart disease Father     Social History   Socioeconomic History   Marital status: Single    Spouse name: Not on file   Number of children: Not on file   Years of education: Not on file   Highest education level: Never attended school  Occupational History   Not on file  Tobacco Use   Smoking status: Never   Smokeless tobacco: Not on file   Tobacco comments:    Nerver smoked  Substance and Sexual Activity   Alcohol use: No   Drug use: No   Sexual activity: Yes    Birth control/protection: None  Other Topics Concern   Not on file  Social History Narrative   ** Merged History Encounter **  Social Drivers of Corporate investment banker Strain: Low Risk  (09/15/2023)   Overall Financial Resource Strain (CARDIA)    Difficulty of Paying Living Expenses: Not hard at all  Food Insecurity: No Food Insecurity (09/15/2023)   Hunger Vital Sign    Worried About Running Out of Food in the Last Year: Never true    Ran Out of Food in the Last Year: Never true  Transportation Needs: No Transportation Needs (09/15/2023)   PRAPARE - Administrator, Civil Service (Medical): No    Lack of Transportation (Non-Medical): No  Physical Activity: Insufficiently Active (09/15/2023)   Exercise Vital Sign    Days of Exercise per Week: 5 days    Minutes of Exercise per Session: 10 min  Stress: No Stress Concern Present (09/15/2023)   [employer] of Occupational Health - Occupational Stress  Questionnaire    Feeling of Stress : Not at all  Social Connections: Moderately Integrated (09/15/2023)   Social Connection and Isolation Panel [NHANES]    Frequency of Communication with Friends and Family: More than three times a week    Frequency of Social Gatherings with Friends and Family: Three times a week    Attends Religious Services: 1 to 4 times per year    Active Member of Clubs or Organizations: No    Attends Engineer, structural: Not on file    Marital Status: Living with partner  Intimate Partner Violence: Not on file                                                                                                  Objective:  Physical Exam: BP (!) 144/84   Pulse 82   Temp (!) 97.5 F (36.4 C) (Temporal)   Ht 5\' 5"  (1.651 m)   Wt 222 lb (100.7 kg)   SpO2 97%   BMI 36.94 kg/m     Physical Exam Constitutional:      Appearance: Normal appearance.  HENT:     Head: Normocephalic and atraumatic.     Right Ear: Hearing normal.     Left Ear: Hearing normal.     Nose: Nose normal.  Eyes:     General: No scleral icterus.       Right eye: No discharge.        Left eye: No discharge.     Extraocular Movements: Extraocular movements intact.  Cardiovascular:     Rate and Rhythm: Normal rate and regular rhythm.     Heart sounds: Normal heart sounds.  Pulmonary:     Effort: Pulmonary effort is normal.     Breath sounds: Normal breath sounds.  Abdominal:     Palpations: Abdomen is soft.     Tenderness: There is no abdominal tenderness.  Musculoskeletal:     Left foot: Deformity and bunion present.  Skin:    General: Skin is warm.     Findings: No rash.  Neurological:     General: No focal deficit present.     Mental Status: He is alert.     Cranial Nerves: No cranial  nerve deficit.  Psychiatric:        Mood and Affect: Mood normal.        Behavior: Behavior normal.        Thought Content: Thought content normal.        Judgment: Judgment normal.        important suggestion  View Detailed Reports    important suggestion  View Condensed Results Report    Contains abnormal data Protein, Total, Random Urine Order: [id number]  suggestion  Information displayed in this report may not trend or trigger automated decision support.   Component Ref Range & Units 2 mo ago  Protein, Total, Urine <10 mg/dL 409 High   Creatinine, Urine >=20 mg/dL 94  Protein/Creatinine Ratio, Urine <=200 mg/g creat 4,096 High   Comment: Total Protein/Creatinine Ratio: > 3500 mg/g - Nephrotic Proteinuria   Specimen Collected: 07/07/23 14:06   Performed by: Michel Harrow [facility](CLIA# [id number]) Last Resulted: 07/07/23 18:44  Received From: [facility]  Result Received: 08/19/23 08:32   View Encounter      Received Information  Contains abnormal data POC Urinalysis Auto without Microscopic Order: [id number]  suggestion  Information displayed in this report may not trend or trigger automated decision support.   Component Ref Range & Units 2 mo ago  Color, Urine Yellow Yellow  Clarity, Urine Clear Clear  Glucose, Urine Negative mg/dL Negative  Bilirubin, Urine Negative Negative  Ketones, Urine Negative mg/dL Negative  Specific Gravity, Urine 1.010, 1.015, 1.020, 1.025 1.025  Blood, Urine Negative Small Abnormal   pH, Urine 5.0, 5.5, 6.0, 6.5, 7.0, 7.5, 8.0 6.0  Protein, Urine Negative mg/dL >=784 Abnormal   Urobilinogen, Urine <2.0 mg/dL 0.2  Nitrite, Urine Negative Negative  Leukocyte Esterase, Urine Negative Negative  Kit/Device Lot # [id number]  Kit/Device Expiration Date 04/12/2024   Specimen Collected: 07/07/23 14:04   Performed by: [facility](CLIA# [id number]) Last Resulted: 07/07/23 14:04  Received From: [facility]  Result Received: 08/19/23 08:32   View Encounter      Received Information  Contains abnormal data Renal Function Panel Order: [id number]  suggestion  Information displayed in this  report may not trend or trigger automated decision support.   Component Ref Range & Units 2 mo ago  Sodium 136 - 145 mmol/L 138  Potassium 3.5 - 5.1 mmol/L 4.1  Comment: NO VISIBLE HEMOLYSIS  Chloride 98 - 107 mmol/L 103  CO2 21 - 31 mmol/L 29  Anion Gap 6 - 14 mmol/L 6  Glucose, Random 70 - 99 mg/dL 403 High   Blood Urea Nitrogen (BUN) 7 - 25 mg/dL 19  Creatinine 4.74 - 2.59 mg/dL 5.63 High   eGFR >87 FI/EPP/2.95J8 44 Low   Comment: GFR estimated by CKD-EPI equations(NKF 2021).  "Recommend confirmation of Cr-based eGFR by using Cys-based eGFR and other filtration markers (if applicable) in complex cases and clinical decision-making, as needed."  Albumin 3.5 - 5.7 g/dL 3.7  Calcium 8.6 - 84.1 mg/dL 9.9  Phosphorus 2.5 - 5.0 mg/dL 3.4  BUN/Creatinine Ratio 10.0 - 20.0 10.1   Specimen Collected: 06/30/23 14:33   Performed by: Michel Harrow [facility](CLIA# [id number]) Last Resulted: 06/30/23 21:26  Received From: [facility]  Result Received: 08/19/23 08:32   View Encounter      Received Information  Contains abnormal data Vitamin D, 25-Hydroxy Order: [id number]  suggestion  Information displayed in this report may not trend or trigger automated decision support.   Component Ref Range &  Units 2 mo ago  Vitamin D 25-Hydroxy 30.0 - 100.0 ng/mL 21.7 Low   Comment: Deficient: <20 ng/mL Insufficient: 20-30 ng/mL Sufficient: 30-100 ng/mL Upper Safety Limit/Toxicity: >100 ng/mL   Specimen Collected: 06/30/23 14:33   Performed by: Michel Harrow [facility](CLIA# [id number]) Last Resulted: 06/30/23 21:49  Received From: [facility]  Result Received: 08/19/23 08:32   View Encounter      Received Information  Contains abnormal data CBC without Differential Order: [id number]  suggestion  Information displayed in this report may not trend or trigger automated decision support.   Component Ref Range & Units 2 mo ago  WBC 4.40 - 11.00  10*3/uL 5.4  RBC 4.50 - 5.90 10*6/uL 4.48 Low   Hemoglobin 14.0 - 17.5 g/dL 84.6  Hematocrit 96.2 - 50.4 % 41.2 Low   Mean Corpuscular Volume (MCV) 80.0 - 96.0 fL 92  Mean Corpuscular Hemoglobin (MCH) 27.5 - 33.2 pg 32.2  Mean Corpuscular Hemoglobin Conc (MCHC) 33.0 - 37.0 g/dL 35  Red Cell Distribution Width (RDW) 12.3 - 17.0 % 12.7  Platelet Count (PLT) 150 - 450 10*3/uL 266  Mean Platelet Volume (MPV) 6.8 - 10.2 fL 8.6   Specimen Collected: 06/30/23 14:33   Performed by: Michel Harrow [facility](CLIA# [id number]) Last Resulted: 06/30/23 17:56  Received From: [facility]  Result Received: 08/19/23 08:32   View Encounter      Received Information  No results found.  No results found for this or any previous visit (from the past 2160 hours).      Garner Nash, MD, MS

## 2023-09-16 NOTE — Patient Instructions (Signed)
 VISIT SUMMARY:  Today, we discussed your concerns about prostate cancer screening, foot pain, and reviewed your current health conditions including hypertension, chronic kidney disease, and vitamin D deficiency. We also talked about the need for updated cholesterol and diabetes screenings.  YOUR PLAN:  -HYPERTENSION: Hypertension, or high blood pressure, can lead to serious health problems if not managed properly. Your current blood pressure is 144/86 mmHg, and your home readings range from 140s to 160s over 80s. To help control your blood pressure and protect your kidneys, continue taking ramipril 10 mg daily and add amlodipine 5 mg daily. We will recheck your blood pressure at your next visit.  -CHRONIC KIDNEY DISEASE (CKD) STAGE 3: Chronic Kidney Disease Stage 3 means your kidneys are moderately damaged and not working as well as they should. Your current management includes taking ramipril to control your blood pressure and protect your kidneys. Continue seeing your nephrologist, Dr. Glori Luis, and we will monitor your kidney function and protein levels.  -FOOT PAIN: You have intermittent pain in your right foot, possibly due to a bunion, which sometimes makes it hard to walk. We will refer you to a podiatrist for a thorough evaluation and management plan.  -VITAMIN D DEFICIENCY: Vitamin D deficiency means you have low levels of vitamin D, which is important for bone health and overall well-being. Continue taking 3000 IU of vitamin D daily, and we will recheck your levels at your next visit.  -GENERAL HEALTH MAINTENANCE: We discussed the importance of prostate cancer screening and the need for updated cholesterol and diabetes screenings. Your last cholesterol check was in 2016 and showed elevated levels. We will schedule fasting labs to check your cholesterol, A1c, and glucose levels, and perform a physical exam at your next visit.  INSTRUCTIONS:  Please schedule a follow-up appointment in one month  for a physical exam and comprehensive fasting labs. At that visit, we will recheck your blood pressure, vitamin D levels, kidney function, cholesterol, A1c, glucose, liver function, thyroid, and blood counts.  For more information, you can read your full clinical note, available in your patient portal.

## 2023-10-05 ENCOUNTER — Ambulatory Visit: Payer: Self-pay | Admitting: Podiatry

## 2023-10-19 ENCOUNTER — Encounter: Payer: Self-pay | Admitting: Family Medicine

## 2023-11-20 ENCOUNTER — Encounter: Payer: Self-pay | Admitting: Family Medicine

## 2023-11-20 ENCOUNTER — Ambulatory Visit (INDEPENDENT_AMBULATORY_CARE_PROVIDER_SITE_OTHER): Payer: Self-pay | Admitting: Family Medicine

## 2023-11-20 VITALS — HR 84 | Resp 18

## 2023-11-20 DIAGNOSIS — N1832 Chronic kidney disease, stage 3b: Secondary | ICD-10-CM

## 2023-11-20 DIAGNOSIS — Z0000 Encounter for general adult medical examination without abnormal findings: Secondary | ICD-10-CM

## 2023-11-20 DIAGNOSIS — I151 Hypertension secondary to other renal disorders: Secondary | ICD-10-CM

## 2023-11-20 DIAGNOSIS — R801 Persistent proteinuria, unspecified: Secondary | ICD-10-CM

## 2023-11-20 DIAGNOSIS — E66812 Obesity, class 2: Secondary | ICD-10-CM

## 2023-11-20 DIAGNOSIS — M1A39X0 Chronic gout due to renal impairment, multiple sites, without tophus (tophi): Secondary | ICD-10-CM

## 2023-11-20 DIAGNOSIS — G4733 Obstructive sleep apnea (adult) (pediatric): Secondary | ICD-10-CM

## 2023-11-20 DIAGNOSIS — Z6836 Body mass index (BMI) 36.0-36.9, adult: Secondary | ICD-10-CM

## 2023-11-20 DIAGNOSIS — E559 Vitamin D deficiency, unspecified: Secondary | ICD-10-CM

## 2023-11-20 DIAGNOSIS — R9431 Abnormal electrocardiogram [ECG] [EKG]: Secondary | ICD-10-CM

## 2023-11-20 DIAGNOSIS — I129 Hypertensive chronic kidney disease with stage 1 through stage 4 chronic kidney disease, or unspecified chronic kidney disease: Secondary | ICD-10-CM

## 2023-11-20 DIAGNOSIS — Z125 Encounter for screening for malignant neoplasm of prostate: Secondary | ICD-10-CM

## 2023-11-20 DIAGNOSIS — Z1159 Encounter for screening for other viral diseases: Secondary | ICD-10-CM

## 2023-11-20 DIAGNOSIS — Z1211 Encounter for screening for malignant neoplasm of colon: Secondary | ICD-10-CM

## 2023-11-20 LAB — COMPREHENSIVE METABOLIC PANEL WITH GFR
ALT: 17 U/L (ref 0–53)
AST: 17 U/L (ref 0–37)
Albumin: 3.7 g/dL (ref 3.5–5.2)
Alkaline Phosphatase: 77 U/L (ref 39–117)
BUN: 20 mg/dL (ref 6–23)
CO2: 28 meq/L (ref 19–32)
Calcium: 9 mg/dL (ref 8.4–10.5)
Chloride: 102 meq/L (ref 96–112)
Creatinine, Ser: 1.88 mg/dL — ABNORMAL HIGH (ref 0.40–1.50)
GFR: 42.17 mL/min — ABNORMAL LOW (ref >60.00)
Glucose, Bld: 107 mg/dL — ABNORMAL HIGH (ref 70–99)
Potassium: 4.1 meq/L (ref 3.5–5.1)
Sodium: 138 meq/L (ref 135–145)
Total Bilirubin: 0.5 mg/dL (ref 0.2–1.2)
Total Protein: 6.5 g/dL (ref 6.0–8.3)

## 2023-11-20 LAB — URINALYSIS, ROUTINE W REFLEX MICROSCOPIC
Bilirubin Urine: NEGATIVE
Ketones, ur: NEGATIVE
Leukocytes,Ua: NEGATIVE
Nitrite: NEGATIVE
Specific Gravity, Urine: 1.015 (ref 1.000–1.030)
Total Protein, Urine: >=300 — AB
Urine Glucose: NEGATIVE
Urobilinogen, UA: 0.2 (ref 0.0–1.0)
pH: 6.5 (ref 5.0–8.0)

## 2023-11-20 LAB — CBC WITH DIFFERENTIAL/PLATELET
Basophils Absolute: 0 10*3/uL (ref 0.0–0.1)
Basophils Relative: 0.5 % (ref 0.0–3.0)
Eosinophils Absolute: 0.3 10*3/uL (ref 0.0–0.7)
Eosinophils Relative: 6.7 % — ABNORMAL HIGH (ref 0.0–5.0)
HCT: 41.9 % (ref 39.0–52.0)
Hemoglobin: 14.3 g/dL (ref 13.0–17.0)
Lymphocytes Relative: 38.5 % (ref 12.0–46.0)
Lymphs Abs: 1.9 10*3/uL (ref 0.7–4.0)
MCHC: 34.1 g/dL (ref 30.0–36.0)
MCV: 92.4 fl (ref 78.0–100.0)
Monocytes Absolute: 0.4 10*3/uL (ref 0.1–1.0)
Monocytes Relative: 8.9 % (ref 3.0–12.0)
Neutro Abs: 2.3 10*3/uL (ref 1.4–7.7)
Neutrophils Relative %: 45.4 % (ref 43.0–77.0)
Platelets: 281 10*3/uL (ref 150.0–400.0)
RBC: 4.53 Mil/uL (ref 4.22–5.81)
RDW: 13 % (ref 11.5–15.5)
WBC: 5 10*3/uL (ref 4.0–10.5)

## 2023-11-20 LAB — URIC ACID: Uric Acid, Serum: 10.2 mg/dL — ABNORMAL HIGH (ref 4.0–7.8)

## 2023-11-20 LAB — LIPID PANEL
Cholesterol: 305 mg/dL — ABNORMAL HIGH (ref 0–200)
HDL: 40.5 mg/dL (ref >39.00)
NonHDL: 264.22
Total CHOL/HDL Ratio: 8
Triglycerides: 684 mg/dL — ABNORMAL HIGH (ref 0.0–149.0)
VLDL: 136.8 mg/dL — ABNORMAL HIGH (ref 0.0–40.0)

## 2023-11-20 LAB — MICROALBUMIN / CREATININE URINE RATIO
Creatinine,U: 60.6 mg/dL
Microalb Creat Ratio: 3793.4 mg/g — ABNORMAL HIGH (ref 0.0–30.0)
Microalb, Ur: 230 mg/dL — ABNORMAL HIGH (ref 0.0–1.9)

## 2023-11-20 LAB — PHOSPHORUS: Phosphorus: 3.5 mg/dL (ref 2.3–4.6)

## 2023-11-20 LAB — B12 AND FOLATE PANEL
Folate: 18.2 ng/mL (ref >5.9)
Vitamin B-12: 963 pg/mL — ABNORMAL HIGH (ref 211–911)

## 2023-11-20 LAB — HEMOGLOBIN A1C: Hgb A1c MFr Bld: 7.5 % — ABNORMAL HIGH (ref 4.6–6.5)

## 2023-11-20 LAB — PSA: PSA: 1.48 ng/mL (ref 0.10–4.00)

## 2023-11-20 LAB — TSH: TSH: 1.84 u[IU]/mL (ref 0.35–5.50)

## 2023-11-20 LAB — LDL CHOLESTEROL, DIRECT: Direct LDL: 93 mg/dL

## 2023-11-20 MED ORDER — RAMIPRIL 10 MG PO CAPS
10.0000 mg | ORAL_CAPSULE | Freq: Every day | ORAL | 3 refills | Status: AC
  Filled 2023-12-18: qty 90, 90d supply, fill #0
  Filled 2023-12-29: qty 30, 30d supply, fill #0
  Filled 2024-01-25: qty 30, 30d supply, fill #1
  Filled 2024-02-26: qty 30, 30d supply, fill #2
  Filled 2024-05-04 (×2): qty 30, 30d supply, fill #3
  Filled 2024-06-02: qty 30, 30d supply, fill #4
  Filled 2024-06-30: qty 30, 30d supply, fill #5
  Filled 2024-08-08: qty 30, 30d supply, fill #6

## 2023-11-20 MED ORDER — AMLODIPINE BESYLATE 10 MG PO TABS
10.0000 mg | ORAL_TABLET | Freq: Every day | ORAL | 3 refills | Status: DC
Start: 2023-11-20 — End: 2024-05-04
  Filled 2023-12-18: qty 90, 90d supply, fill #0
  Filled 2023-12-18 – 2023-12-29 (×2): qty 30, 30d supply, fill #0
  Filled 2024-01-25: qty 30, 30d supply, fill #1
  Filled 2024-02-26: qty 30, 30d supply, fill #2

## 2023-11-20 MED ORDER — VITAMIN D3 25 MCG (1000 UNIT) PO TABS
1000.0000 [IU] | ORAL_TABLET | Freq: Every day | ORAL | 3 refills | Status: AC
Start: 2023-11-20 — End: 2024-11-14

## 2023-11-20 NOTE — Patient Instructions (Signed)
  VISIT SUMMARY: Today, you came in for a follow-up visit to review your lab work and discuss your ongoing health concerns, including hypertension, chronic kidney disease, and gout. We also talked about your interest in cancer screenings and general health maintenance.  YOUR PLAN: -HYPERTENSION: Hypertension, or high blood pressure, is when the force of the blood against your artery walls is too high. Your blood pressure readings at home have been elevated, but they improve with an increased dose of amlodipine . Continue taking amlodipine  10 mg daily and ramipril 10 mg daily. We will refill your ramipril prescription. An echocardiogram has been ordered due to previous mild EKG changes, and a baseline ECG was performed during your visit.  -CHRONIC KIDNEY DISEASE, STAGE 3: Chronic kidney disease stage 3 means your kidneys are moderately damaged and not working as well as they should. We will monitor your kidney function and associated complications. We will recheck your urine for proteinuria and microalbumin ratios, and check your vitamin D, parathyroid hormone, phosphorus, calcium, and metabolic panel. We will also check your iron, ferritin, CBC, folate, and B12 levels to assess for anemia.  -PROTEINURIA: Proteinuria is the presence of excess protein in your urine, which is often a sign of kidney damage. We will recheck your urine for proteinuria and microalbumin ratios to monitor this condition.  -ABNORMAL EKG: An abnormal EKG can indicate issues with your heart's electrical activity. Your previous EKG showed mild T wave abnormalities, but your current EKG shows no significant changes. We have ordered an echocardiogram for further evaluation and may consider a cardiology evaluation and a coronary artery calcium score to assess your risk for heart disease.  -GOUT: Gout is a form of arthritis characterized by sudden, severe attacks of pain, swelling, and redness in the joints, often triggered by certain  foods. We discussed dietary modifications to prevent flare-ups. We will check your baseline uric acid levels in your blood and urine and provide literature on dietary modifications.  -OBESITY, CLASS 1: Class 1 obesity means your body mass index (BMI) is between 30 and 34.9. We discussed the importance of weight management and lifestyle modifications. We will screen for metabolic dysfunction associated with steatotic liver disease with liver function tests from your complete metabolic panel.  -SLEEP APNEA: Sleep apnea is a sleep disorder where breathing repeatedly stops and starts. You are managing this condition with a CPAP machine and reported no new concerns.  -VITAMIN D DEFICIENCY: Vitamin D deficiency means you have lower than normal levels of vitamin D, which is important for bone health. We will check your vitamin D levels and you should continue taking vitamin D 1000 units daily.  Brodie Cannon VISIT: During your routine wellness visit, we discussed the importance of regular screenings and health maintenance. We recommend a colon oscopy for colon cancer screening and a PSA test for prostate cancer screening. We also discussed hepatitis C and HIV screening as part of your primary care workup.  INSTRUCTIONS: Please continue taking your medications as prescribed. Schedule an echocardiogram and follow up with the recommended lab tests. We will refer you for a colonoscopy and order a PSA test for prostate cancer screening. If you have any new symptoms or concerns, please contact our office.

## 2023-11-20 NOTE — Addendum Note (Signed)
 Addended by: Genevive Ket D on: 11/20/2023 03:24 PM   Modules accepted: Orders

## 2023-11-20 NOTE — Progress Notes (Signed)
 Assessment  Assessment/Plan:  Assessment and Plan Assessment & Plan Hypertension Hypertension with previous home blood pressures at 148/89 mmHg, currently managed with amlodipine  10 mg and ramipril 10 mg. Blood pressure improved to 116/70 mmHg after two doses of amlodipine . Discussed beta blockers for blood pressure management in the context of progressive kidney disease. He prefers beta blockers but tolerates amlodipine  without side effects. - Continue amlodipine  10 mg daily. - Continue ramipril 10 mg daily. - Refill ramipril prescription. - Order echocardiogram due to previous mild EKG changes. - Perform baseline ECG during visit.  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 Monitoring required for kidney function and associated complications. Discussed the importance of monitoring vitamin D, calcium, and parathyroid hormone levels due to his role in kidney function. - Recheck urine for proteinuria and microalbumin ratios. - Check vitamin D, parathyroid hormone, phosphorus, calcium, and metabolic panel. - Check iron, ferritin, CBC, folate, and B12 to assess for anemia.  Proteinuria Proteinuria associated with chronic kidney disease. Requires monitoring and management. - Recheck urine for proteinuria and microalbumin ratios.  Abnormal EKG Previous EKG showed mild T wave abnormalities. No significant changes on current EKG with sinus rhythm. Echocardiogram recommended for further evaluation due to previous EKG changes. - Order echocardiogram for further cardiac evaluation. - Consider cardiology evaluation to reduce risk of heart disease. - Consider coronary artery calcium score to help risk stratify for ASCVD.  Gout Gout with dietary triggers. Discussed dietary modifications to prevent flare-ups. He reports specific food triggers causing symptoms. - Check baseline uric acid levels in blood and urine. - Provide literature on  dietary modifications to prevent gout flare-ups.  Obesity, class 1 Class 1 obesity. Discussed the importance of weight management and lifestyle modifications. - Screen for metabolic dysfunction associated steatotic liver disease with LFTs from complete metabolic panel.  Sleep Apnea Sleep apnea managed with CPAP machine. No new concerns reported.  Vitamin D deficiency Vitamin D deficiency managed with supplementation. Monitoring required. - Check vitamin D levels. - Continue vitamin D 1000 units daily.  Wellness Visit Routine wellness visit for a 48 year old male. Discussed the importance of regular screenings and health maintenance. Colon cancer screening recommended due to age and medical history. Discussed prostate cancer screening due to higher risk as an [place] American male. - Refer for colonoscopy for colon cancer screening. - Order PSA test for prostate cancer screening. - Discuss hepatitis C and HIV screening as part of primary care workup.      Medications Discontinued During This Encounter  Medication Reason   cholecalciferol (VITAMIN D3) 25 MCG (1000 UNIT) tablet Reorder   ramipril (ALTACE) 10 MG capsule Reorder   amLODipine  (NORVASC ) 5 MG tablet     Patient Counseling(The following topics were reviewed and/or handout was given):  -Nutrition: Stressed importance of moderation in sodium/caffeine intake, saturated fat and cholesterol, caloric balance, sufficient intake of fresh fruits, vegetables, and fiber.  -Stressed the importance of regular exercise.   -Substance Abuse: Discussed cessation/primary prevention of tobacco, alcohol, or other drug use; driving or other dangerous activities under the influence; availability of treatment for abuse.   -Injury prevention: Discussed safety belts, safety helmets, smoke detector, smoking near bedding or upholstery.   -Sexuality: Discussed sexually transmitted diseases, partner selection, use of condoms, avoidance of unintended  pregnancy and contraceptive alternatives.   -Dental health: Discussed importance of regular tooth brushing, flossing, and dental visits.  -Health maintenance and immunizations reviewed. Please refer to Health maintenance section.  Return in about 6 months (around 05/22/2024) for BP.        Subjective:   Encounter date: 11/20/2023  Chief Complaint  Patient presents with   Annual Exam    Pt is not fasting today   HM due- colonoscopy, HIV and Hep C screening    Hypertension    Pt c/o of evaluated BP reading ranges are 148/89; currently taking 10 mg of Amlodipine .     Discussed the use of AI scribe software for clinical note transcription with the patient, who gave verbal consent to proceed.  History of Present Illness Andrew Garza is a 48 year old male with hypertension and chronic kidney disease who presents for a follow-up visit and lab work review.  He has been experiencing elevated blood pressures at home, with readings around 148/89 mmHg. He takes amlodipine  10 mg daily, occasionally doubling the dose to achieve better control, which lowers his blood pressure to 116/70 mmHg.  He has a history of chronic kidney disease stage 3, nephrosclerosis, and proteinuria. He is currently on ramipril 10 mg daily. A previous kidney biopsy did not fully elucidate the cause of his proteinuria.  He has experienced episodes of gout, particularly triggered by certain foods like [employer] pizza, which caused significant foot swelling and pain. After experiencing swelling and pain in his foot, he avoided certain foods and increased his water intake, which helped his symptoms resolve.  He has a history of sleep apnea and uses a CPAP machine. He has not had recent imaging of his heart but recalls mild EKG changes in the past. No current chest pain, shortness of breath, urinary problems, or joint pain.  He is planning to play paintball with his stepson for his birthday. He is interested in cancer  screenings, including prostate and colon cancer, and has had previous hepatitis C and HIV screenings.       11/20/2023    2:10 PM 09/16/2023    2:10 PM  Depression screen PHQ 2/9  Decreased Interest 0 0  Down, Depressed, Hopeless 0 0  PHQ - 2 Score 0 0  Altered sleeping 0 0  Tired, decreased energy 0 0  Change in appetite 0 0  Feeling bad or failure about yourself  0 0  Trouble concentrating 0 0  Moving slowly or fidgety/restless 0 0  Suicidal thoughts 0 0  PHQ-9 Score 0 0  Difficult doing work/chores Not difficult at all Not difficult at all       11/20/2023    2:10 PM 09/16/2023    2:10 PM  GAD 7 : Generalized Anxiety Score  Nervous, Anxious, on Edge 0 1  Control/stop worrying 0 0  Worry too much - different things 0 1  Trouble relaxing 0 0  Restless 0 0  Easily annoyed or irritable 0 1  Afraid - awful might happen 0 1  Total GAD 7 Score 0 4  Anxiety Difficulty Not difficult at all Not difficult at all    Health Maintenance Due  Topic Date Due   HIV Screening  Never done   Hepatitis C Screening  Never done   Colonoscopy  Never done       PMH:  The following were reviewed and entered/updated in epic: Past Medical History:  Diagnosis Date   Hypertension    Renal disorder     Patient Active Problem List   Diagnosis Date Noted   OSA on CPAP 11/20/2023   Nephrosclerosis 11/20/2023   Hypertension secondary to other renal  disorders 11/20/2023   Gout 09/16/2023   Stage 3b chronic kidney disease (HCC) 12/30/2021   Vitamin D deficiency 06/14/2018   Chronic kidney disease (CKD) stage G3b/A3, moderately decreased glomerular filtration rate (GFR) between 30-44 mL/min/1.73 square meter and albuminuria creatinine ratio greater than 300 mg/g (HCC) 05/22/2017   Obesity due to excess calories without serious comorbidity 01/30/2016   Persistent proteinuria, unspecified 01/30/2016    History reviewed. No pertinent surgical history.  Family History  Problem Relation Age  of Onset   Cancer Father    Diabetes Father    Heart disease Father     Medications- reviewed and updated Outpatient Medications Prior to Visit  Medication Sig Dispense Refill   amLODipine  (NORVASC ) 5 MG tablet Take 1 tablet (5 mg total) by mouth daily. 90 tablet 0   cholecalciferol (VITAMIN D3) 25 MCG (1000 UNIT) tablet Take 1,000 Units by mouth daily.     ramipril (ALTACE) 10 MG capsule Take 10 mg by mouth daily.     No facility-administered medications prior to visit.    No Known Allergies  Social History   Socioeconomic History   Marital status: Single    Spouse name: Not on file   Number of children: Not on file   Years of education: Not on file   Highest education level: Never attended school  Occupational History   Not on file  Tobacco Use   Smoking status: Never   Smokeless tobacco: Not on file   Tobacco comments:    Nerver smoked  Substance and Sexual Activity   Alcohol use: No   Drug use: No   Sexual activity: Yes    Birth control/protection: None  Other Topics Concern   Not on file  Social History Narrative   ** Merged History Encounter **       Social Drivers of Health   Financial Resource Strain: Low Risk  (09/15/2023)   Overall Financial Resource Strain (CARDIA)    Difficulty of Paying Living Expenses: Not hard at all  Food Insecurity: No Food Insecurity (09/15/2023)   Hunger Vital Sign    Worried About Running Out of Food in the Last Year: Never true    Ran Out of Food in the Last Year: Never true  Transportation Needs: No Transportation Needs (09/15/2023)   PRAPARE - Administrator, Civil Service (Medical): No    Lack of Transportation (Non-Medical): No  Physical Activity: Insufficiently Active (09/15/2023)   Exercise Vital Sign    Days of Exercise per Week: 5 days    Minutes of Exercise per Session: 10 min  Stress: No Stress Concern Present (09/15/2023)   [employer] of Occupational Health - Occupational Stress Questionnaire     Feeling of Stress : Not at all  Social Connections: Moderately Integrated (09/15/2023)   Social Connection and Isolation Panel [NHANES]    Frequency of Communication with Friends and Family: More than three times a week    Frequency of Social Gatherings with Friends and Family: Three times a week    Attends Religious Services: 1 to 4 times per year    Active Member of Clubs or Organizations: No    Attends Engineer, structural: Not on file    Marital Status: Living with partner           Objective:  Physical Exam: Pulse 84   Resp 18   SpO2 96%   There is no height or weight on file to calculate BMI. Wt Readings from Last  3 Encounters:  09/16/23 222 lb (100.7 kg)  02/16/23 204 lb (92.5 kg)  06/03/15 190 lb 4.1 oz (86.3 kg)    Physical Exam GENERAL: Alert, cooperative, well developed, no acute distress. HEENT: Normocephalic, normal oropharynx, moist mucous membranes, cerumen present bilaterally in ears. CHEST: Clear to auscultation bilaterally, no wheezes, rhonchi, or crackles. CARDIOVASCULAR: Normal heart rate and rhythm, S1 and S2 normal without murmurs. ABDOMEN: Soft, non-tender, non-distended, without organomegaly, normal bowel sounds. EXTREMITIES: No cyanosis or edema. NEUROLOGICAL: Cranial nerves grossly intact, moves all extremities without gross motor or sensory deficit.  Physical Exam      Prior labs:   No results found for this or any previous visit (from the past 2160 hours).  No results found for: "CHOL" No results found for: "HDL" No results found for: "LDLCALC" No results found for: "TRIG" No results found for: "CHOLHDL" No results found for: "LDLDIRECT"  Last metabolic panel Lab Results  Component Value Date   GLUCOSE 105 (H) 02/16/2023   NA 140 02/16/2023   K 4.1 02/16/2023   CL 106 02/16/2023   CO2 28 02/16/2023   BUN 17 02/16/2023   CREATININE 1.75 (H) 02/16/2023   GFRNONAA 48 (L) 02/16/2023   CALCIUM 9.4 02/16/2023   ANIONGAP 6  02/16/2023    No results found for: "HGBA1C"  Last CBC Lab Results  Component Value Date   WBC 6.1 02/16/2023   HGB 14.2 02/16/2023   HCT 41.4 02/16/2023   MCV 91.8 02/16/2023   MCH 31.5 02/16/2023   RDW 12.9 02/16/2023   PLT 248 02/16/2023    No results found for: "TSH"  No results found for: "PSA1", "PSA"  Last vitamin D No results found for: "25OHVITD2", "25OHVITD3", "VD25OH"  Lab Results  Component Value Date   BILIRUBINUR NEGATIVE 02/16/2023   PROTEINUR >300 (A) 02/16/2023   LEUKOCYTESUR NEGATIVE 02/16/2023    No results found for: "LABMICR", "MICROALBUR"   At today's visit, we discussed treatment options, associated risk and benefits, and engage in counseling as needed.  Additionally the following were reviewed: Past medical records, past medical and surgical history, family and social background, as well as relevant laboratory results, imaging findings, and specialty notes, where applicable.  This message was generated using dictation software, and as a result, it may contain unintentional typos or errors.  Nevertheless, extensive effort was made to accurately convey at the pertinent aspects of the patient visit.    There may have been are other unrelated non-urgent complaints, but due to the busy schedule and the amount of time already spent with him, time does not permit to address these issues at today's visit. Another appointment may have or has been requested to review these additional issues.     Harle Libra, MD, MS

## 2023-11-21 LAB — HCV AB W REFLEX TO QUANT PCR: HCV Ab: NONREACTIVE

## 2023-11-21 LAB — HCV INTERPRETATION

## 2023-11-23 ENCOUNTER — Encounter: Payer: Self-pay | Admitting: Family Medicine

## 2023-11-23 ENCOUNTER — Other Ambulatory Visit: Payer: Self-pay | Admitting: Family Medicine

## 2023-11-23 DIAGNOSIS — N1831 Chronic kidney disease, stage 3a: Secondary | ICD-10-CM | POA: Insufficient documentation

## 2023-11-23 DIAGNOSIS — E782 Mixed hyperlipidemia: Secondary | ICD-10-CM | POA: Insufficient documentation

## 2023-11-23 DIAGNOSIS — N1832 Chronic kidney disease, stage 3b: Secondary | ICD-10-CM

## 2023-11-23 DIAGNOSIS — N2581 Secondary hyperparathyroidism of renal origin: Secondary | ICD-10-CM | POA: Insufficient documentation

## 2023-11-23 MED ORDER — LANCET DEVICE MISC
1.0000 | Freq: Three times a day (TID) | 0 refills | Status: AC
Start: 2023-11-23 — End: 2023-12-23

## 2023-11-23 MED ORDER — INSULIN GLARGINE-YFGN 100 UNIT/ML ~~LOC~~ SOPN
10.0000 [IU] | PEN_INJECTOR | Freq: Every day | SUBCUTANEOUS | 0 refills | Status: DC
Start: 2023-11-23 — End: 2023-12-18

## 2023-11-23 MED ORDER — BLOOD GLUCOSE MONITORING SUPPL DEVI
1.0000 | Freq: Three times a day (TID) | 0 refills | Status: AC
Start: 2023-11-23 — End: ?

## 2023-11-23 MED ORDER — BLOOD GLUCOSE TEST VI STRP
1.0000 | ORAL_STRIP | Freq: Three times a day (TID) | 0 refills | Status: AC
Start: 2023-11-23 — End: 2023-12-23

## 2023-11-23 MED ORDER — LANCETS MISC. MISC
1.0000 | Freq: Three times a day (TID) | 0 refills | Status: AC
Start: 2023-11-23 — End: 2023-12-23

## 2023-11-23 MED ORDER — ROSUVASTATIN CALCIUM 40 MG PO TABS
40.0000 mg | ORAL_TABLET | Freq: Every day | ORAL | 3 refills | Status: AC
  Filled 2023-12-18: qty 90, 90d supply, fill #0
  Filled 2023-12-29: qty 30, 30d supply, fill #0
  Filled 2024-01-25: qty 30, 30d supply, fill #1
  Filled 2024-02-26: qty 30, 30d supply, fill #2
  Filled 2024-05-04: qty 30, 30d supply, fill #3
  Filled 2024-06-02: qty 30, 30d supply, fill #4
  Filled 2024-06-30: qty 30, 30d supply, fill #5
  Filled 2024-08-01: qty 30, 30d supply, fill #6

## 2023-11-23 MED ORDER — PEN NEEDLES 31G X 5 MM MISC: 1.0000 | Freq: Three times a day (TID) | 0 refills | Status: DC

## 2023-11-25 ENCOUNTER — Ambulatory Visit: Payer: Self-pay

## 2023-11-25 ENCOUNTER — Telehealth: Payer: Self-pay

## 2023-11-25 LAB — IRON,TIBC AND FERRITIN PANEL
%SAT: 33 % (ref 20–48)
Ferritin: 226 ng/mL (ref 38–380)
Iron: 101 ug/dL (ref 50–180)
TIBC: 310 ug/dL (ref 250–425)

## 2023-11-25 LAB — VITAMIN D 1,25 DIHYDROXY
Vitamin D 1, 25 (OH)2 Total: 62 pg/mL (ref 18–72)
Vitamin D2 1, 25 (OH)2: <8 pg/mL
Vitamin D3 1, 25 (OH)2: 62 pg/mL

## 2023-11-25 LAB — PARATHYROID HORMONE, INTACT (NO CA): PTH: 107 pg/mL — ABNORMAL HIGH (ref 16–77)

## 2023-11-25 LAB — URIC ACID, RANDOM URINE

## 2023-11-25 LAB — HIV ANTIBODY (ROUTINE TESTING W REFLEX): HIV 1&2 Ab, 4th Generation: NONREACTIVE

## 2023-11-25 NOTE — Progress Notes (Signed)
 Complex Care Management Note Care Guide Note  11/25/2023 Name: Andrew Garza MRN: [id number] DOB: 03-05-1976   Complex Care Management Outreach Attempts: An unsuccessful telephone outreach was attempted today to offer the patient information about available complex care management services.  Follow Up Plan:  Additional outreach attempts will be made to offer the patient complex care management information and services.   Encounter Outcome:  No Answer  [facility]  [facility], [facility] Health Care Management Assistant Direct Dial: [phone]  Fax: [phone]

## 2023-11-25 NOTE — Progress Notes (Signed)
 Complex Care Management Note  Care Guide Note 11/25/2023 Name: Andrew Garza MRN: [id number] DOB: [birth year]  Andrew Garza is a 48 y.o. year old male who sees Catheryn Cluck, MD for primary care. I reached out to Garrett Kallman by phone today to offer complex care management services.  [name] was given information about Complex Care Management services today including:   The Complex Care Management services include support from the care team which includes your Nurse Care Manager, Clinical Social Worker, or Pharmacist.  The Complex Care Management team is here to help remove barriers to the health concerns and goals most important to you. Complex Care Management services are voluntary, and the patient may decline or stop services at any time by request to their care team member.   Complex Care Management Consent Status: Patient agreed to services and verbal consent obtained.   Follow up plan:  Telephone appointment with complex care management team member scheduled for:  11/30/23 & 12/14/23  Encounter Outcome:  Patient Scheduled  [facility]  [facility], [facility] Health Care Management Assistant Direct Dial: [phone]  Fax: [phone]

## 2023-11-30 ENCOUNTER — Other Ambulatory Visit: Payer: Self-pay | Admitting: Licensed Clinical Social Worker

## 2023-11-30 NOTE — Patient Outreach (Signed)
 Complex Care Management   Visit Note  11/30/2023  Name:  Andrew Garza MRN: [id number] DOB: 1976-04-06  Situation: Referral received for Complex Care Management related to recent diabetes diagnosis and no insurance. I obtained verbal consent from Patient.  Visit completed with patient.  on the phone  Background:   Past Medical History:  Diagnosis Date   Hypertension    Renal disorder     Assessment: PT was recently diagnosed with diabetes. Pt is unclear with the severity of his diabetes. Pt was referred to Pharmacy and is waiting for pharmacy contact regarding his medication management. Pt confirmed he received his glucose monitor and his cholesterol medication. However, he will follow-up with pharmacy to inquire about cost of insulin  medication. SW did advised about discount medication cards because Pt does not have insurance. Pt covers medical expensive's out-of-pocket.   SDOH Interventions    Flowsheet Row Patient Outreach Telephone from 11/30/2023 in [place] POPULATION HEALTH DEPARTMENT  SDOH Interventions   Food Insecurity Interventions Intervention Not Indicated  Housing Interventions Intervention Not Indicated  Transportation Interventions Intervention Not Indicated  Utilities Interventions Intervention Not Indicated         Recommendation:   PCP Follow-up Pt will contact PCP office and SW will inbox PCP to inform that PT needs someone to call and explain more about his recent diabetes diagnosis. PT also has a Forensic psychologist appointment on 12/14/2023.  Follow Up Plan:   Telephone follow up appointment date/time:  12/15/2023 at 11AM  Jonda Neighbours, PhD [facility], [facility] Social Worker Direct Dial: [phone]  Fax: [phone]

## 2023-11-30 NOTE — Patient Instructions (Signed)
 Visit Information  Thank you for taking time to visit with me today. Please don't hesitate to contact me if I can be of assistance to you before our next scheduled appointment.  Our next appointment is by telephone on 12/15/2023 at 11 Please call the care guide team at [phone] if you need to cancel or reschedule your appointment.   Following is a copy of your care plan:   Goals Addressed             This Visit's Progress    BSW VBCI Social Work Care Plan       Problems:   PT was recently diagnosed with diabetes. PT does not have a full understanding of his condition. PT was advised to follow-up with his PCP regarding the severity of his diagnosis and will follow PCP recommendations.   CSW Clinical Goal(s):   Over the next 2 weeks  Patient will follow up with PCP in regards to understanding severity of his diabetes, pharmacy and understanding insulin  price, and PT will follow-up with Medicaid to reapply as directed by Social Work.  Interventions:  SW will send PCP an in-basket message to contact PT since PT disclosed he is unclear about the severity of his diabetes. SW also let the VBCI Nurse know that PT had concerns regarding his diabetes.    Patient Goals/Self-Care Activities:  Coordinate with PCP and VBCI Nurse Rn to assist with diabetes education. Follow up on insurance options with DSS to reapply for Medicaid. Pt states he might not qualify due to being over-income, but will try to reapply.  Plan:   Telephone follow up appointment with care management team member scheduled for:  12-15-2023 at 11AM with BSW.         Please call the Suicide and Crisis Lifeline: 988 go to [facility] Urgent [facility] [address] [place] [phone]) call 911 if you are experiencing a Mental Health or Behavioral Health Crisis or need someone to talk to.  Patient verbalizes understanding of instructions and care plan provided today and agrees to view in  MyChart. Active MyChart status and patient understanding of how to access instructions and care plan via MyChart confirmed with patient.     Jonda Neighbours, PhD [facility], [facility] Social Worker Direct Dial: [phone]  Fax: [phone]

## 2023-12-14 ENCOUNTER — Other Ambulatory Visit: Payer: Self-pay

## 2023-12-14 ENCOUNTER — Telehealth: Payer: Self-pay

## 2023-12-14 NOTE — Progress Notes (Unsigned)
   12/14/2023  Patient ID: Andrew Garza, male   DOB: 20-Jun-1976, 48 y.o.   MRN: [id number]  Outreach for scheduled telephone visit to assist with access/affordability of medications was unsuccessful.  Called x2 and left HIPAA compliant voicemail with my direct phone number.  Will attempt to get appointment rescheduled.  Linn Rich, PharmD, DPLA

## 2023-12-15 ENCOUNTER — Telehealth: Payer: Self-pay | Admitting: Family Medicine

## 2023-12-15 ENCOUNTER — Telehealth: Payer: Self-pay

## 2023-12-15 ENCOUNTER — Other Ambulatory Visit: Payer: Self-pay | Admitting: Licensed Clinical Social Worker

## 2023-12-15 NOTE — Progress Notes (Signed)
 Complex Care Management Care Guide Note  12/15/2023 Name: Andrew Garza MRN: [id number] DOB: [birth year]  Andrew Garza is a 48 y.o. year old male who is a primary care patient of Andrew Cluck, MD and is actively engaged with the care management team. I reached out to Andrew Garza by phone today to assist with re-scheduling  with the Pharmacist.  Follow up plan: Patient shared he will await outreach to reschedule from [facility]. Contact information of scheduler provided for future reference if needed.  [facility]  [facility], [facility] Health Care Management Assistant Direct Dial: [phone]  Fax: [phone]

## 2023-12-15 NOTE — Progress Notes (Signed)
   12/15/2023  Patient ID: Andrew Garza, male   DOB: [birth year], 48 y.o.   MRN: [id number]  Patient outreach to rescheduled missed telephone visit yesterday successful- visit has been rescheduled to Thursday at 930am.  Linn Rich, PharmD, DPLA

## 2023-12-15 NOTE — Telephone Encounter (Signed)
 Copied from CRM [id number]. Topic: General - Other >> Dec 15, 2023  9:22 AM Kita Perish H wrote: Reason for CRM: Patient is returning call to [facility] regarding his missed appointment on 6/2 with population health.  Andrew Garza [phone]

## 2023-12-15 NOTE — Patient Outreach (Signed)
 Complex Care Management   Visit Note  12/15/2023  Name:  Andrew Garza MRN: [id number] DOB: 10/20/1975  Situation: Referral received for Complex Care Management related to SDOH Barriers:  Financial Resource Strain I obtained verbal consent from Patient.  Visit completed with patient  on the phone  Background:   Past Medical History:  Diagnosis Date   Hypertension    Renal disorder     Assessment: Patient is awaiting a call from the Pharm D, patient missed a call from Pharm D on yesterday. Patient is still paying out of pocket, Patient stated that he applied for Medicaid years ago and was over income.    SDOH Screenings   Food Insecurity: No Food Insecurity (12/15/2023)  Housing: Low Risk  (12/15/2023)  Transportation Needs: No Transportation Needs (12/15/2023)  Utilities: Not At Risk (12/15/2023)  Depression (PHQ2-9): Low Risk  (11/20/2023)  Financial Resource Strain: Medium Risk (12/15/2023)  Physical Activity: Insufficiently Active (09/15/2023)  Social Connections: Moderately Integrated (09/15/2023)  Stress: No Stress Concern Present (09/15/2023)  Tobacco Use: Unknown (11/20/2023)      Recommendation:   none  Follow Up Plan:   Telephone follow up appointment date/time:  12/29/2023 at 11:30 am  Jonda Neighbours, PhD [facility], [facility] Social Worker Direct Dial: [phone]  Fax: [phone]

## 2023-12-15 NOTE — Patient Instructions (Signed)
 Visit Information  Thank you for taking time to visit with me today. Please don't hesitate to contact me if I can be of assistance to you before our next scheduled appointment.  Your next care management appointment is by telephone on 12/29/2023 at 11:30 am    Please call the care guide team at [phone] if you need to cancel, schedule, or reschedule an appointment.   Please call the Suicide and Crisis Lifeline: 988 go to [facility] Urgent [facility] [address] [place] [phone]) call 911 if you are experiencing a Mental Health or Behavioral Health Crisis or need someone to talk to.  Jonda Neighbours, PhD [facility], [facility] Social Worker Direct Dial: [phone]  Fax: [phone]

## 2023-12-17 ENCOUNTER — Other Ambulatory Visit: Payer: Self-pay

## 2023-12-17 ENCOUNTER — Encounter (HOSPITAL_BASED_OUTPATIENT_CLINIC_OR_DEPARTMENT_OTHER): Payer: Self-pay

## 2023-12-17 DIAGNOSIS — E1122 Type 2 diabetes mellitus with diabetic chronic kidney disease: Secondary | ICD-10-CM

## 2023-12-17 MED ORDER — GLIPIZIDE 5 MG PO TABS
5.0000 mg | ORAL_TABLET | Freq: Two times a day (BID) | ORAL | 3 refills | Status: DC
  Filled 2023-12-18 – 2024-01-08 (×2): qty 60, 30d supply, fill #0

## 2023-12-17 NOTE — Addendum Note (Signed)
 Addended by: Kasandra Pain B on: 12/17/2023 03:22 PM   Modules accepted: Orders

## 2023-12-17 NOTE — Progress Notes (Signed)
 12/17/2023 Name: Andrew Garza MRN: [id number] DOB: 05/12/1976  Chief Complaint  Patient presents with   Medication Management   Andrew Garza is a 48 y.o. year old male who presented for a telephone visit.   They were referred to the pharmacist by their PCP for assistance in managing diabetes.    Subjective:  Care Team: Primary Care Provider: Catheryn Cluck, MD ; Next Scheduled Visit: 11/10  Medication Access/Adherence  Current Pharmacy:  [facility] [address], [place] - [address] [place] [address] Phone: [phone] Fax: [phone]  [id number] [place], [place] - [address] AT [facility] [address] [place] [address] Phone: [phone] Fax: [phone]  -Patient reports affordability concerns with their medications: Yes  -Patient reports access/transportation concerns to their pharmacy: No  -Patient reports adherence concerns with their medications:  Yes    Diabetes: Current medications: none -Recently diagnosed with diabetes with A1c of 7.5% -Semglee  10 units daily prescribed but patient has not picked up or started using.  Patient does not have insurance, so likely unable to afford; and he also wanted more information on diagnosis and medication options to treat. -Current glucose readings: FBG 150-170 -Meter, strips, and lancets sent to [facility]; but pharmacy states these were not picked up (likely due to cost), but patient endorses having testing supplies at home -Patient endorses drinking soft drinks and sweet tea but also a lot of water throughout the day  Hypertension: Current medications: ramipril  10mg  daily, amlodipine  5mg  daily -Recently prescribed amlodipine  10mg , but [facility] states last fill was for 5mg  daily #90 on 3/5 -Patient does have home BP monitor and endorses readings 122-132/70/80 -Last OV reading elevated at 144/84 on 3/5  Hyperlipidemia/ASCVD Risk  Reduction Current lipid lowering medications: rosuvastatin  40mg  daily  Antiplatelet regimen: none  ASCVD History: NA Family History: Heart disease-father Risk Factors: HTN, DM  PREVENT Risk Score: 10 year risk of CVD: 48.2% - 10 year risk of ASCVD: 301% - 10 year risk of HF: 33%  Medication Affordability and Adherence -Patient does not have prescription insurance, and [facility] reports the following last fills/copays for medications: Ramipril  10mg  daily #90 filled 5/16 for $23.99 Amlodipine  5mg  daily #90 filled 3/5 for $35.97 Rosuvastatin  40mg  daily #90 filled 5/12 for $51 -Patient has not filled glucometer, test strips, or lancets with [facility]  Objective: Lab Results  Component Value Date   HGBA1C 7.5 (H) 11/20/2023   Lab Results  Component Value Date   CREATININE 1.88 (H) 11/20/2023   BUN 20 11/20/2023   NA 138 11/20/2023   K 4.1 11/20/2023   CL 102 11/20/2023   CO2 28 11/20/2023   Lab Results  Component Value Date   CHOL 305 (H) 11/20/2023   HDL 40.50 11/20/2023   LDLDIRECT 93.0 11/20/2023   TRIG (H) 11/20/2023    684.0 Triglyceride is over 400; calculations on Lipids are invalid.   CHOLHDL 8 11/20/2023   Medications Reviewed Today     Reviewed by Linn Rich, [facility] (Pharmacist) on 12/17/23 at [phone]  Med List Status: <None>   Medication Order Taking? Sig Documenting Provider Last Dose Status Informant  amLODipine  (NORVASC ) 10 MG tablet [id number] Yes Take 1 tablet (10 mg total) by mouth daily. Catheryn Cluck, MD Taking Active   Blood Glucose Monitoring Suppl DEVI [id number] Yes 1 each by Does not apply route in the morning, at noon, and at bedtime. May substitute to any manufacturer covered  by patient's insurance. Catheryn Cluck, MD Taking Active   cholecalciferol (VITAMIN D3) 25 MCG (1000 UNIT) tablet [id number]  Take 1 tablet (1,000 Units total) by mouth daily. Catheryn Cluck, MD  Active   Glucose Blood (BLOOD GLUCOSE TEST STRIPS) STRP [id number] Yes 1 each  by In Vitro route in the morning, at noon, and at bedtime. May substitute to any manufacturer covered by patient's insurance. Catheryn Cluck, MD Taking Active   insulin  glargine-yfgn (SEMGLEE , YFGN,) 100 UNIT/ML Pen [id number] No Inject 10 Units into the skin daily.  Patient not taking: Reported on 12/17/2023   Catheryn Cluck, MD Not Taking Active   Insulin  Pen Needle (PEN NEEDLES) 31G X 5 MM MISC [id number] No 1 each by Does not apply route 3 (three) times daily with meals. May substitute to any manufacturer covered by patient's insurance.  Patient not taking: Reported on 12/17/2023   Catheryn Cluck, MD Not Taking Active   Lancet Device MISC [id number] Yes 1 each by Does not apply route in the morning, at noon, and at bedtime. May substitute to any manufacturer covered by patient's insurance. Catheryn Cluck, MD Taking Active   Lancets Misc. MISC [id number] Yes 1 each by Does not apply route in the morning, at noon, and at bedtime. May substitute to any manufacturer covered by patient's insurance. Catheryn Cluck, MD Taking Active   ramipril  (ALTACE ) 10 MG capsule [id number] Yes Take 1 capsule (10 mg total) by mouth daily. Catheryn Cluck, MD Taking Active   rosuvastatin  (CRESTOR ) 40 MG tablet [id number] Yes Take 1 tablet (40 mg total) by mouth daily. Catheryn Cluck, MD Taking Active            Assessment/Plan:   Diabetes: -Currently uncontrolled with A1c >7% -Reviewed long term cardiovascular and renal outcomes of uncontrolled blood sugar -Reviewed goal A1c, goal fasting, and goal 2 hour post prandial glucose -Reviewed dietary modifications including decreasing sugary drinks, such as soda and sweet tea  -Insulin  not necessary and patient would prefer alternative therapy at this point.  Could consider metformin ER 500mg  daily x1 week, increasing to BID after if tolerating well.  Patient's eGFR 42 on 5/19. Initiating metformin in people with an eGFR between 30 and 45 mL/minute/1.73 m2  is not recommended per FDA-approved labeling; however, guidelines recommend metformin use in patients with type 2 diabetes, chronic kidney disease, and an eGFR of 30 mL/minute/1.73 m2 or greater. Initiate metformin at 50% of recommended dose and titrate upwards to a maximum of 1,000 mg/day in these patients -If metformin is initiated, I would recommend CMP 2 weeks after starting and again in another month -Other pharmacotherapy options that would be affordable include:  sulfonylureas or pioglitazone -Due for follow-up A1c 8/9  Hypertension: -Currently moderately controlled -Reviewed long term cardiovascular and renal outcomes of uncontrolled blood pressure -Reviewed appropriate blood pressure monitoring technique and reviewed goal blood pressure. -Recommended to check home blood pressure and heart rate at least 3x/week -Recommend to increase amlodipine  to 10mg  daily   Hyperlipidemia/ASCVD Risk Reduction: -Currently uncontrolled.  -Recommend to continue current regimen -Recommend follow-up lipid panel in August when next A1c is completed -If LDL, TC and TG remain elevated, could consider ezetimibe 10mg  daily and/or fenofibrate 145mg  daily- both on [facility] discount list for patient's w/o insurance   Medication Affordability and Adherence -Will verify what meter, strips and lancets patient is using to see if OTC generic may be cheaper  -Current medication copays if filled at  a [facility]: Ramipril  10mg  daily #90 $15 Amlodipine  5mg  daily #90 $15 Rosuvastatin  40mg  daily #90 $30 -Metformin XR 500mg  or glipizide XL 5mg  would be $15, pioglitazone 30 or 45mg  would be $30 (90 days of each) if prescribed  Follow Up Plan:  Sending message to consult with Dr. Hildy Lowers in regard to DM plan- telephone follow-up 6/6 with patient to discuss medication affordability options  Linn Rich, PharmD, DPLA

## 2023-12-18 ENCOUNTER — Other Ambulatory Visit (HOSPITAL_COMMUNITY): Payer: Self-pay

## 2023-12-18 ENCOUNTER — Other Ambulatory Visit: Payer: Self-pay

## 2023-12-18 NOTE — Progress Notes (Signed)
   12/18/2023  Patient ID: Andrew Garza, male   DOB: May 25, 1976, 48 y.o.   MRN: [id number]  Diabetes: Current medications: glipizide 5mg  BID -Recently diagnosed with diabetes with A1c of 7.5% -Current glucose readings: FBG 150-170 -Patient is using OTC glucometer, strips, and lancets from [facility] based on cost and not having prescription insurance -Meter, strips, and lancets sent to [facility]; but pharmacy states these were not picked up (likely due to cost), but patient endorses having  -Patient did not pick up/start insulin  recently prescribed (cost/preference), so Dr. Hildy Lowers has sent an order for glipizide 5mg  BID   Hypertension: Current medications: ramipril  10mg  daily, amlodipine  5mg  daily -Recently prescribed amlodipine  10mg , but patient has been taking 5mg  daily  -Patient does have home BP monitor and endorses readings 122-132/70/80 -Last OV reading elevated at 144/84 on 3/5   Hyperlipidemia/ASCVD Risk Reduction Current lipid lowering medications: rosuvastatin  40mg  daily   Antiplatelet regimen: none   ASCVD History: NA Family History: Heart disease-father Risk Factors: HTN, DM   PREVENT Risk Score: 10 year risk of CVD: 48.2% - 10 year risk of ASCVD: 301% - 10 year risk of HF: 33%   Medication Affordability and Adherence -Patient does not have prescription insurance, and he would like to transfer medications to [facility] to be able to utilize discount drug list- this will make medication costs approximately 50% of what he has been paying  Assessment/Plan:    Diabetes: -Currently uncontrolled with A1c >7% -Start glipizide 5mg  BID -Educated patient on administration of glipizide right before breakfast and supper- do not take if not eating meal -Encouraged balanced meals with lean proteins and whole grain carbohydrates and vegetables -Continue to monitor FBG daily -Due for follow-up A1c 8/9   Hypertension: -Currently moderately controlled -Recommended to check home blood  pressure and heart rate at least 3x/week -Patient to increase amlodipine  to 10mg  daily    Hyperlipidemia/ASCVD Risk Reduction: -Currently uncontrolled.  -Recommend to continue current regimen -Recommend follow-up lipid panel in August when next A1c is completed -If LDL, TC and TG remain elevated, could consider ezetimibe 10mg  daily and/or fenofibrate 145mg  daily- both on [facility] discount list for patient's w/o insurance    Medication Affordability and Adherence -Coordinating with [facility] to transfer ramipril  10mg , amlodipine  10mg , rosuvastatin  40mg , and glipizide 5mg  BID from [facility].  Place ramipril  and atorvastatin on hold and fill amlodipine  and glipizide. -Sending patient a MyChart message with copay information for glipizide and amlodipine , address/phone/hours for [facility], and my direct number   Follow Up Plan:  3 weeks  Linn Rich, PharmD, DPLA

## 2023-12-28 ENCOUNTER — Other Ambulatory Visit (HOSPITAL_COMMUNITY): Payer: Self-pay

## 2023-12-29 ENCOUNTER — Encounter: Payer: Self-pay | Admitting: Licensed Clinical Social Worker

## 2023-12-29 ENCOUNTER — Other Ambulatory Visit (HOSPITAL_COMMUNITY): Payer: Self-pay

## 2023-12-29 ENCOUNTER — Other Ambulatory Visit: Payer: Self-pay | Admitting: Licensed Clinical Social Worker

## 2024-01-04 ENCOUNTER — Telehealth: Payer: Self-pay

## 2024-01-04 NOTE — Progress Notes (Signed)
 Complex Care Management Note  Care Guide Note 01/04/2024 Name: Nataniel Gasper MRN: [id number] DOB: 06/08/1976  Oluwadamilare Tobler is a 48 y.o. year old male who sees Sebastian Beverley NOVAK, MD for primary care. I reached out to Elouise Decant by phone today to offer complex care management services.  Mr. Zimbelman was given information about Complex Care Management services today including:   The Complex Care Management services include support from the care team which includes your Nurse Care Manager, Clinical Social Worker, or Pharmacist.  The Complex Care Management team is here to help remove barriers to the health concerns and goals most important to you. Complex Care Management services are voluntary, and the patient may decline or stop services at any time by request to their care team member.   Complex Care Management Consent Status: Patient did not agree to participate in complex care management services at this time.  Follow up plan:  Patient plans to follow up with [facility].  Encounter Outcome:  Patient Refused  Dreama Agent [facility], [facility] Health Care Management Assistant Direct Dial: [phone]  Fax: [phone]

## 2024-01-04 NOTE — Progress Notes (Signed)
 Complex Care Management Note Care Guide Note  01/04/2024 Name: Andrew Garza MRN: [id number] DOB: Feb 21, 1976   Complex Care Management Outreach Attempts: An unsuccessful telephone outreach was attempted today to offer the patient information about available complex care management services.  Follow Up Plan:  Additional outreach attempts will be made to offer the patient complex care management information and services.   Encounter Outcome:  No Answer  Dreama [facility]  [facility], [facility] Health Care Management Assistant Direct Dial: [phone]  Fax: [phone]

## 2024-01-05 ENCOUNTER — Ambulatory Visit (HOSPITAL_COMMUNITY): Payer: Self-pay | Attending: Internal Medicine

## 2024-01-06 ENCOUNTER — Encounter (HOSPITAL_COMMUNITY): Payer: Self-pay | Admitting: Family Medicine

## 2024-01-07 ENCOUNTER — Encounter: Payer: Self-pay | Admitting: Family Medicine

## 2024-01-08 ENCOUNTER — Other Ambulatory Visit: Payer: Self-pay

## 2024-01-08 ENCOUNTER — Other Ambulatory Visit (HOSPITAL_COMMUNITY): Payer: Self-pay

## 2024-01-08 NOTE — Progress Notes (Signed)
   01/08/2024  Patient ID: Andrew Garza, male   DOB: [birth year], 48 y.o.   MRN: [id number]  Diabetes: Current medications: none -Recently diagnosed with diabetes with A1c of 7.5% -Patient has not picked up glipizide  5mg  BID from [facility] yet per pharmacy -Has not checked home BG since we last spoke -Patient endorses having OTC glucometer, strips, and lancets from [facility] (too expensive as rx)   Hypertension: Current medications: ramipril  10mg  daily, amlodipine  10mg  daily -Patient did recently pick up ramipril  10mg  and increased dose of amlodipine  (10mg  daily) per pharmacy -Patient does have home BP monitor and endorses readings </=130/80 -Last OV reading elevated at 144/84 on 3/5   Hyperlipidemia/ASCVD Risk Reduction Current lipid lowering medications: rosuvastatin  40mg  daily -Rosuvastatin  was picked up from [facility] recently per pharmacy Antiplatelet regimen: none   ASCVD History: NA Family History: Heart disease-father Risk Factors: HTN, DM   PREVENT Risk Score: 10 year risk of CVD: 48.2% - 10 year risk of ASCVD: 301% - 10 year risk of HF: 33%   Assessment/Plan:    Diabetes: -Currently uncontrolled with A1c >7% -Start glipizide  5mg  BID- pharmacy is refilling and state copay will be $5 -Educated patient on administration of glipizide  right before breakfast and supper- do not take if not eating meal -Encouraged balanced meals with lean proteins and whole grain carbohydrates and vegetables -Begin monitoring FBG daily -Due for follow-up A1c 8/9   Hypertension: -Currently moderately controlled -Recommended to check home blood pressure and heart rate at least 3x/week -Patient to increase amlodipine  to 10mg  daily    Hyperlipidemia/ASCVD Risk Reduction: -Currently uncontrolled.  -Recommend to continue current regimen -Recommend follow-up lipid panel in August when next A1c is completed -If LDL, TC and TG remain elevated, could consider ezetimibe 10mg  daily and/or fenofibrate 145mg   daily- both on [facility] discount list for patient's w/o insurance   Follow Up Plan:  2 weeks   Channing DELENA Mealing, PharmD, DPLA

## 2024-01-08 NOTE — Progress Notes (Unsigned)
   01/08/2024  Patient ID: Andrew Garza, male   DOB: 10-02-1975, 48 y.o.   MRN: [id number]  Diabetes: Current medications: glipizide  5mg  BID -Recently diagnosed with diabetes with A1c of 7.5% -Current glucose readings: FBG 150-170 -Patient is using OTC glucometer, strips, and lancets from [facility] based on cost and not having prescription insurance -Meter, strips, and lancets sent to [facility]; but pharmacy states these were not picked up (likely due to cost), but patient endorses having  -Patient did not pick up/start insulin  recently prescribed (cost/preference), so Dr. Sebastian has sent an order for glipizide  5mg  BID   Hypertension: Current medications: ramipril  10mg  daily, amlodipine  5mg  daily -Recently prescribed amlodipine  10mg , but patient has been taking 5mg  daily  -Patient does have home BP monitor and endorses readings 122-132/70/80 -Last OV reading elevated at 144/84 on 3/5   Hyperlipidemia/ASCVD Risk Reduction Current lipid lowering medications: rosuvastatin  40mg  daily   Antiplatelet regimen: none   ASCVD History: NA Family History: Heart disease-father Risk Factors: HTN, DM   PREVENT Risk Score: 10 year risk of CVD: 48.2% - 10 year risk of ASCVD: 301% - 10 year risk of HF: 33%   Medication Affordability and Adherence -Patient does not have prescription insurance, and he would like to transfer medications to [facility] to be able to utilize discount drug list- this will make medication costs approximately 50% of what he has been paying   Assessment/Plan:    Diabetes: -Currently uncontrolled with A1c >7% -Start glipizide  5mg  BID -Educated patient on administration of glipizide  right before breakfast and supper- do not take if not eating meal -Encouraged balanced meals with lean proteins and whole grain carbohydrates and vegetables -Continue to monitor FBG daily -Due for follow-up A1c 8/9   Hypertension: -Currently moderately controlled -Recommended to check home blood  pressure and heart rate at least 3x/week -Patient to increase amlodipine  to 10mg  daily    Hyperlipidemia/ASCVD Risk Reduction: -Currently uncontrolled.  -Recommend to continue current regimen -Recommend follow-up lipid panel in August when next A1c is completed -If LDL, TC and TG remain elevated, could consider ezetimibe 10mg  daily and/or fenofibrate 145mg  daily- both on [facility] discount list for patient's w/o insurance    Medication Affordability and Adherence -Coordinating with [facility] to transfer ramipril  10mg , amlodipine  10mg , rosuvastatin  40mg , and glipizide  5mg  BID from [facility].  Place ramipril  and atorvastatin on hold and fill amlodipine  and glipizide . -Sending patient a MyChart message with copay information for glipizide  and amlodipine , address/phone/hours for [facility], and my direct number   Follow Up Plan:  3 weeks   Channing DELENA Mealing, PharmD, DPLA

## 2024-01-11 ENCOUNTER — Encounter: Payer: Self-pay | Admitting: Family Medicine

## 2024-01-11 DIAGNOSIS — Z1211 Encounter for screening for malignant neoplasm of colon: Secondary | ICD-10-CM

## 2024-01-12 ENCOUNTER — Other Ambulatory Visit: Payer: Self-pay | Admitting: Licensed Clinical Social Worker

## 2024-01-12 NOTE — Patient Instructions (Signed)

## 2024-01-12 NOTE — Patient Outreach (Signed)
 Complex Care Management   Visit Note  01/12/2024  Name:  Andrew Garza MRN: [id number] DOB: 08-Nov-1975  Situation: Referral received for Complex Care Management related to Pharmacy referral  I obtained verbal consent from Patient.  Visit completed with paient  on the phone  Background:   Past Medical History:  Diagnosis Date   Hypertension    Renal disorder     Assessment: Patient received the Pharm D assistance for his RX's  SDOH Interventions    Flowsheet Row Patient Outreach from 01/12/2024 in [place] POPULATION HEALTH DEPARTMENT Patient Outreach from 12/15/2023 in [place] POPULATION HEALTH DEPARTMENT Patient Outreach Telephone from 11/30/2023 in [place] POPULATION HEALTH DEPARTMENT  SDOH Interventions     Food Insecurity Interventions Intervention Not Indicated Intervention Not Indicated Intervention Not Indicated  Housing Interventions Intervention Not Indicated Intervention Not Indicated Intervention Not Indicated  Transportation Interventions Intervention Not Indicated Intervention Not Indicated Intervention Not Indicated  Utilities Interventions Intervention Not Indicated Intervention Not Indicated Intervention Not Indicated  Financial Strain Interventions Intervention Not Indicated Other (Comment)  [Pt is paying out of pocket for RX, referral referral sent to Andrew Garza] --    Recommendation:   none  Follow Up Plan:   Closing From:  Complex Care Management  Andrew Garza Andrew HEDWIG, PhD [facility], [facility] Social Worker Direct Dial: [phone]  Fax: [phone]

## 2024-01-25 ENCOUNTER — Other Ambulatory Visit (HOSPITAL_COMMUNITY): Payer: Self-pay

## 2024-01-25 ENCOUNTER — Telehealth: Payer: Self-pay | Admitting: Family Medicine

## 2024-01-25 ENCOUNTER — Other Ambulatory Visit: Payer: Self-pay

## 2024-01-25 DIAGNOSIS — E1122 Type 2 diabetes mellitus with diabetic chronic kidney disease: Secondary | ICD-10-CM

## 2024-01-25 MED ORDER — GLIPIZIDE 5 MG PO TABS
5.0000 mg | ORAL_TABLET | Freq: Two times a day (BID) | ORAL | 3 refills | Status: AC
Start: 2024-01-25 — End: ?
  Filled 2024-01-25 – 2024-02-08 (×2): qty 60, 30d supply, fill #0
  Filled 2024-03-10: qty 60, 30d supply, fill #1
  Filled 2024-05-04: qty 60, 30d supply, fill #2
  Filled 2024-06-02: qty 60, 30d supply, fill #3
  Filled ????-??-??: fill #0

## 2024-01-25 NOTE — Progress Notes (Signed)
 Responding to patient calling in about refill of Glipizide . Rx sent in to pharmacy on file- [facility]

## 2024-01-25 NOTE — Telephone Encounter (Signed)
 Pt is requesting a refill for his Rx #: [id number]  glipiZIDE  (GLUCOTROL ) 5 MG tablet [[id number]]

## 2024-01-27 ENCOUNTER — Other Ambulatory Visit: Payer: Self-pay

## 2024-01-27 NOTE — Progress Notes (Signed)
   01/27/2024  Patient ID: Andrew Garza, male   DOB: 1976-04-12, 48 y.o.   MRN: [id number]  Subjective/Objective Patient outreach to follow-up on management of chronic conditions  Diabetes: Current medications: glipizide  5mg  BID -Recently diagnosed with diabetes with A1c of 7.5% -Patient states he is needing a refill for glipizide , but fill history reflects a 30d supply was picked up 7/1 -Patient has checked some home BG readings since we last spoke, and these are elevated at 230-320 -Using OTC glucometer, strips, and lancets from [facility] (too expensive as rx)   Hypertension: Current medications: ramipril  10mg  daily, amlodipine  10mg  daily -Patient did recently pick up ramipril  10mg  and increased dose of amlodipine  (10mg  daily) per pharmacy fill history -Patient does have home BP monitor and endorses reading last night of 127/81 -Last OV reading elevated at 144/84 on 3/5   Hyperlipidemia/ASCVD Risk Reduction Current lipid lowering medications: rosuvastatin  40mg  daily -Rosuvastatin  was picked up from [facility] recently per pharmacy fill history Antiplatelet regimen: none   ASCVD History: NA Family History: Heart disease-father Risk Factors: HTN, DM   PREVENT Risk Score: 10 year risk of CVD: 48.2% - 10 year risk of ASCVD: 301% - 10 year risk of HF: 33%   Assessment/Plan:    Diabetes: -Currently uncontrolled with A1c >7% -Patient will double check that he does not have glipizide  5mg  at home; if not, I will contact [facility] to fill -Discussed FBG and post-prandial BG goal -Continue monitoring home BG regularly -Due for follow-up A1c 8/9- was scheduled for follow-up with Dr. Sebastian 11/10, but this has been canceled (informing patient to contact office to reschedule)   Hypertension: -Currently moderately controlled -Continue current regimen -Continue to check home blood pressure and heart rate at least 3x/week   Hyperlipidemia/ASCVD Risk Reduction: -Currently uncontrolled.  -Recommend  to continue current regimen -Recommend follow-up lipid panel in August when next A1c is completed -If LDL, TC and TG remain elevated, could consider ezetimibe 10mg  daily and/or fenofibrate 145mg  daily- both on [facility] discount list for patient's w/o insurance    Follow Up Plan:  4 weeks   Channing DELENA Mealing, PharmD, DPLA

## 2024-02-08 ENCOUNTER — Other Ambulatory Visit (HOSPITAL_COMMUNITY): Payer: Self-pay

## 2024-02-08 ENCOUNTER — Other Ambulatory Visit: Payer: Self-pay

## 2024-02-09 ENCOUNTER — Encounter: Payer: Self-pay | Admitting: Family Medicine

## 2024-02-10 ENCOUNTER — Encounter: Payer: Self-pay | Admitting: Gastroenterology

## 2024-02-23 NOTE — Progress Notes (Signed)
   02/23/2024  Patient ID: Andrew Garza, male   DOB: [birth year], 49 y.o.   MRN: [id number]  Outreach attempt for scheduled telephone visit to follow-up on management of chronic conditions was unsuccessful, but I was able to leave a HIPAA compliant voicemail with my direct phone number.  I am also sending a MyChart message to see if we can reschedule missed visit.  Channing DELENA Mealing, PharmD, DPLA

## 2024-02-24 ENCOUNTER — Other Ambulatory Visit: Payer: Self-pay

## 2024-02-24 NOTE — Progress Notes (Signed)
   02/24/2024  Patient ID: Andrew Garza, male   DOB: 1976-04-05, 48 y.o.   MRN: [id number]  Subjective/Objective Patient returned missed call from earlier to discuss management of chronic conditions  Diabetes: Current medications: glipizide  5mg  BID -Recently diagnosed with diabetes with A1c of 7.5% -Has not tried any other medications for diabetes, but patient does not have medication insurance; so cost may be a barrier for certain pharmacotherapies -Patient has received glipizide  5mg  and is taking BID before meals -Has checked home BG since we last spoke and reports the following readings: 156, 116, and 240.  Unclear how soon before/after eating these readings were, but he does state before the reading of 240 he had some cookies. -Patient endorses having OTC glucometer, strips, and lancets from [facility] (too expensive as rx)   Hypertension: Current medications: ramipril  10mg  daily, amlodipine  10mg  daily -Patient does have home BP monitor and endorses recent readings of 119/77, 116/74, and 135/86   Hyperlipidemia/ASCVD Risk Reduction Current lipid lowering medications: rosuvastatin  40mg  daily -Rosuvastatin  was picked up from [facility] recently per pharmacy Antiplatelet regimen: none   ASCVD History: NA Family History: Heart disease-father Risk Factors: HTN, DM   PREVENT Risk Score: 10 year risk of CVD: 48.2% - 10 year risk of ASCVD: 301% - 10 year risk of HF: 33%   Assessment/Plan:    Diabetes: -Currently uncontrolled with A1c >7% -Continue current regimen at this time -Encouraged balanced meals with lean proteins and whole grain carbohydrates and vegetables; decrease intake of foods high in sugar and simple carbohydrates -Continue to monitor BG regularly: I recommend to check FBG at least 3x/week and post-prandial BG after largest meal of the day at least 3x/week -Due for follow-up A1c but no PCP follow-up currently scheduled- advised patient to contact office to schedule, as it looks  like November appointment was canceled -Consider addition of metformin XR 500mg  BID (increasing to 2 tablets BID in one month if tolerating well)- this medication is also on [facility] discount drug list   Hypertension: -Controlled based on home BP readings -Continue current medication regimen -Continue to check home blood pressure and heart rate at least 3x/week  Hyperlipidemia/ASCVD Risk Reduction: -Currently uncontrolled.  -Recommend to continue current regimen -Recommend follow-up lipid panel at next PCP visit -If LDL, TC and TG remain elevated, could consider ezetimibe 10mg  daily and/or fenofibrate 145mg  daily- both on  Pharmacy discount list for patient's w/o insurance   Follow Up Plan:  6 weeks   Andrew Garza, PharmD, DPLA

## 2024-02-26 ENCOUNTER — Other Ambulatory Visit (HOSPITAL_BASED_OUTPATIENT_CLINIC_OR_DEPARTMENT_OTHER): Payer: Self-pay

## 2024-02-26 ENCOUNTER — Other Ambulatory Visit (HOSPITAL_COMMUNITY): Payer: Self-pay

## 2024-03-02 ENCOUNTER — Telehealth: Payer: Self-pay

## 2024-03-02 NOTE — Progress Notes (Signed)
   03/02/2024  Patient ID: Andrew Garza, male   DOB: [birth year], 48 y.o.   MRN: [id number]  Chart review to see if patient has scheduled a follow-up with Dr. Sebastian since November visit had to be canceled by the office.  No follow-up with PCP is currently scheduled, so I am sending patient a MyChart message to remind him to do so.  He will be due for a follow-up CMP, A1c, and lipid panel.  Channing DELENA Mealing, PharmD, DPLA

## 2024-03-07 ENCOUNTER — Ambulatory Visit (AMBULATORY_SURGERY_CENTER): Payer: Self-pay

## 2024-03-07 VITALS — Ht 66.0 in | Wt 222.0 lb

## 2024-03-07 DIAGNOSIS — Z1211 Encounter for screening for malignant neoplasm of colon: Secondary | ICD-10-CM

## 2024-03-07 NOTE — Progress Notes (Signed)

## 2024-03-10 ENCOUNTER — Other Ambulatory Visit (HOSPITAL_COMMUNITY): Payer: Self-pay

## 2024-03-21 ENCOUNTER — Encounter: Payer: Self-pay | Admitting: Gastroenterology

## 2024-04-05 NOTE — Progress Notes (Unsigned)
   04/06/2024  Patient ID: Andrew Garza, male   DOB: [birth year], 48 y.o.   MRN: [id number]  Subjective/Objective Telephone visit to follow-up on management of chronic conditions  Diabetes: Current medications: glipizide  5mg  BID -Diagnosed with diabetes in May with A1c of 7.5% -Has not tried any other medications for diabetes, but patient does not have medication insurance; so cost may be a barrier for certain pharmacotherapies -Has checked home BG a few times since we last spoke and reports the following readings: 185 (post-prandial but unclear how long after eating), 114 (fasting), and 208 (after drinking Gatorade). -Patient endorses having OTC glucometer, strips, and lancets from [facility] (too expensive as rx)   Hypertension: Current medications: ramipril  10mg  daily, amlodipine  10mg  daily -Patient does have home BP monitor and states BP today was 125/81 -Patient is not taking amlodipine , because he states the medication seems to give him a headache and cause fatigue   Hyperlipidemia/ASCVD Risk Reduction Current lipid lowering medications: rosuvastatin  40mg  daily -Lipid Panel from May 2025:  TC 305, TG 684, HDL 40.5, Direct LDL 109 Antiplatelet regimen: none   ASCVD History: NA Family History: Heart disease-father Risk Factors: HTN, DM   PREVENT Risk Score: 10 year risk of CVD: 48.2% - 10 year risk of ASCVD: 301% - 10 year risk of HF: 33%   Assessment/Plan:    Diabetes: -Currently uncontrolled with A1c >7% -Continue current regimen at this time -Encouraged balanced meals with lean proteins and whole grain carbohydrates and vegetables; decrease intake of foods/drinks high in sugar and simple carbohydrates -Continue to monitor BG regularly: I recommend to check FBG at least 3x/week and post-prandial BG after largest meal of the day at least 3x/week -Due for follow-up A1c but no PCP follow-up currently scheduled; PCP visit in November was canceled -Consider addition of metformin XR  500mg  BID (increasing to 2 tablets BID in one month if tolerating well)- this medication is also on [facility] discount drug list   Hypertension: -Moderately controlled based on home BP readings -Advised patient to take one-half tablet of amlodipine  10mg  to see if this is tolerated and provides additional BP lowering benefit -Continue ramipril  10mg  daily -Continue to check home blood pressure and heart rate at least 3x/week  Hyperlipidemia/ASCVD Risk Reduction: -Currently uncontrolled.  -Recommend to continue current regimen -Recommend follow-up lipid panel and CMP at next PCP visit -If LDL, TC and TG remain elevated, could consider ezetimibe 10mg  daily and/or fenofibrate 145mg  daily- both on [facility] discount list for patient's w/o insurance   Follow Up Plan:  4 weeks   Andrew Garza, PharmD, DPLA

## 2024-04-06 ENCOUNTER — Other Ambulatory Visit: Payer: Self-pay

## 2024-04-06 NOTE — Telephone Encounter (Signed)
 Called patient to schedule OV; left VM for call back.

## 2024-04-25 ENCOUNTER — Encounter: Payer: Self-pay | Admitting: Gastroenterology

## 2024-04-25 ENCOUNTER — Ambulatory Visit: Payer: Self-pay | Admitting: Gastroenterology

## 2024-04-25 VITALS — BP 115/73 | HR 92 | Temp 98.4°F | Resp 21 | Ht 65.0 in | Wt 222.0 lb

## 2024-04-25 DIAGNOSIS — Z1211 Encounter for screening for malignant neoplasm of colon: Secondary | ICD-10-CM

## 2024-04-25 DIAGNOSIS — K562 Volvulus: Secondary | ICD-10-CM

## 2024-04-25 MED ORDER — SODIUM CHLORIDE 0.9 % IV SOLN: 500.0000 mL | Freq: Once | INTRAVENOUS | Status: DC

## 2024-04-25 NOTE — Patient Instructions (Addendum)
 Resume previous diet. Continue present medications.  Repeat colonoscopy in 1 year for screening purposes and because the bowel preparation was suboptimal. Recommend extended 2-day prep for repeat colonoscopy.   YOU HAD AN ENDOSCOPIC PROCEDURE TODAY AT THE [facility]:   Refer to the procedure report that was given to you for any specific questions about what was found during the examination.  If the procedure report does not answer your questions, please call your gastroenterologist to clarify.  If you requested that your care partner not be given the details of your procedure findings, then the procedure report has been included in a sealed envelope for you to review at your convenience later.  YOU SHOULD EXPECT: Some feelings of bloating in the abdomen. Passage of more gas than usual.  Walking can help get rid of the air that was put into your GI tract during the procedure and reduce the bloating. If you had a lower endoscopy (such as a colonoscopy or flexible sigmoidoscopy) you may notice spotting of blood in your stool or on the toilet paper. If you underwent a bowel prep for your procedure, you may not have a normal bowel movement for a few days.  Please Note:  You might notice some irritation and congestion in your nose or some drainage.  This is from the oxygen used during your procedure.  There is no need for concern and it should clear up in a day or so.  SYMPTOMS TO REPORT IMMEDIATELY:  Following lower endoscopy (colonoscopy or flexible sigmoidoscopy):  Excessive amounts of blood in the stool  Significant tenderness or worsening of abdominal pains  Swelling of the abdomen that is new, acute  Fever of 100F or higher  For urgent or emergent issues, a gastroenterologist can be reached at any hour by calling [phone]. Do not use MyChart messaging for urgent concerns.    DIET:  We do recommend a small meal at first, but then you may proceed to your regular diet.   Drink plenty of fluids but you should avoid alcoholic beverages for 24 hours.  ACTIVITY:  You should plan to take it easy for the rest of today and you should NOT DRIVE or use heavy machinery until tomorrow (because of the sedation medicines used during the test).    FOLLOW UP: Our staff will call the number listed on your records the next business day following your procedure.  We will call around 7:15- 8:00 am to check on you and address any questions or concerns that you may have regarding the information given to you following your procedure. If we do not reach you, we will leave a message.     If any biopsies were taken you will be contacted by phone or by letter within the next 1-3 weeks.  Please call us  at [phone] if you have not heard about the biopsies in 3 weeks.    SIGNATURES/CONFIDENTIALITY: You and/or your care partner have signed paperwork which will be entered into your electronic medical record.  These signatures attest to the fact that that the information above on your After Visit Summary has been reviewed and is understood.  Full responsibility of the confidentiality of this discharge information lies with you and/or your care-partner.

## 2024-04-25 NOTE — Op Note (Signed)
 [facility] Patient Name: Andrew Garza Procedure Date: 04/25/2024 10:28 AM MRN: [id number] Endoscopist: Sandor Flatter , MD, [id number] Age: 48 Referring MD:  Date of Birth: 12/08/1975 Gender: Male Account #: [id number] Procedure:                Colonoscopy Indications:              Screening for colorectal malignant neoplasm, This                            is the patient's first colonoscopy Medicines:                Monitored Anesthesia Care Procedure:                Pre-Anesthesia Assessment:                           - Prior to the procedure, a History and Physical                            was performed, and patient medications and                            allergies were reviewed. The patient's tolerance of                            previous anesthesia was also reviewed. The risks                            and benefits of the procedure and the sedation                            options and risks were discussed with the patient.                            All questions were answered, and informed consent                            was obtained. Prior Anticoagulants: The patient has                            taken no anticoagulant or antiplatelet agents. ASA                            Grade Assessment: II - A patient with mild systemic                            disease. After reviewing the risks and benefits,                            the patient was deemed in satisfactory condition to                            undergo the procedure.  After obtaining informed consent, the colonoscope                            was passed under direct vision. Throughout the                            procedure, the patient's blood pressure, pulse, and                            oxygen saturations were monitored continuously. The                            CF [id number] #[id number] was introduced through the anus                            and advanced to the the  cecum, identified by                            appendiceal orifice and ileocecal valve. The                            colonoscopy was technically difficult and complex                            due to inadequate bowel prep and significant                            looping. The patient tolerated the procedure well.                            The quality of the bowel preparation was fair. The                            ileocecal valve, appendiceal orifice, and rectum                            were photographed. Scope In: 10:42:22 AM Scope Out: 11:08:58 AM Scope Withdrawal Time: 0 hours 11 minutes 24 seconds  Total Procedure Duration: 0 hours 26 minutes 36 seconds  Findings:                 The perianal and digital rectal examinations were                            normal.                           The sigmoid colon and ascending colon revealed                            significantly excessive looping. Advancing the                            scope required using manual pressure and  straightening and shortening the scope to obtain                            bowel loop reduction.                           A moderate amount of fiercely adeherent stool was                            found in the entire colon, interfering with                            visualization. Lavage of the area was performed                            using copious amounts of sterile water, resulting                            in incomplete clearance with fair visualization.                           The retroflexed view of the distal rectum and anal                            verge was normal and showed no anal or rectal                            abnormalities. Complications:            No immediate complications. Estimated Blood Loss:     Estimated blood loss: none. Impression:               - Preparation of the colon was fair.                           - There was significant  looping of the colon.                           - Stool in the entire examined colon.                           - The distal rectum and anal verge are normal on                            retroflexion view.                           - No specimens collected.                           - Cannot rule out the presence of small or flat                            polyps on this study due to adherent stool, and  therefore recommend short interval repeat                            colonoscopy for completion of CRC screening. Recommendation:           - Patient has a contact number available for                            emergencies. The signs and symptoms of potential                            delayed complications were discussed with the                            patient. Return to normal activities tomorrow.                            Written discharge instructions were provided to the                            patient.                           - Resume previous diet.                           - Continue present medications.                           - Repeat colonoscopy in 1 year for screening                            purposes and because the bowel preparation was                            suboptimal.                           - Recommend extended 2-day prep for repeat                            colonoscopy.                           - Return to GI office PRN. Sandor Flatter, MD 04/25/2024 11:17:32 AM

## 2024-04-25 NOTE — Progress Notes (Signed)
 Sedate, gd SR, tolerated procedure well, VSS, report to RN

## 2024-04-25 NOTE — Progress Notes (Signed)
 Pt's states no medical or surgical changes since previsit or office visit.

## 2024-04-25 NOTE — Progress Notes (Signed)
 GASTROENTEROLOGY PROCEDURE H&P NOTE   Primary Care Physician: Sebastian Beverley NOVAK, MD    Reason for Procedure:  Colon Cancer screening  Plan:    Colonoscopy  Patient is appropriate for endoscopic procedure(s) in the ambulatory ([facility]) setting.  The nature of the procedure, as well as the risks, benefits, and alternatives were carefully and thoroughly reviewed with the patient. Ample time for discussion and questions allowed. The patient understood, was satisfied, and agreed to proceed.     HPI: Andrew Garza is a 48 y.o. male who presents for colonoscopy for routine Colon Cancer screening.  No active GI symptoms.  Paternal aunt with colon cancer.  No known first-degree relatives with colon cancer.    Patient is otherwise without complaints or active issues today.  Past Medical History:  Diagnosis Date   Diabetes mellitus without complication (HCC)    Hypertension    Renal disorder     Past Surgical History:  Procedure Laterality Date   NO PAST SURGERIES      Prior to Admission medications   Medication Sig Start Date End Date Taking? Authorizing Provider  amLODipine  (NORVASC ) 10 MG tablet Take 1 tablet (10 mg total) by mouth daily. 11/20/23 11/14/24 Yes Sebastian Beverley NOVAK, MD  Blood Glucose Monitoring Suppl DEVI 1 each by Does not apply route in the morning, at noon, and at bedtime. May substitute to any manufacturer covered by patient's insurance. 11/23/23  Yes Sebastian Beverley NOVAK, MD  cholecalciferol (VITAMIN D3) 25 MCG (1000 UNIT) tablet Take 1 tablet (1,000 Units total) by mouth daily. 11/20/23 11/14/24 Yes Sebastian Beverley NOVAK, MD  glipiZIDE  (GLUCOTROL ) 5 MG tablet Take 1 tablet (5 mg total) by mouth 2 (two) times daily before a meal. 01/25/24  Yes Sebastian Beverley NOVAK, MD  ramipril  (ALTACE ) 10 MG capsule Take 1 capsule (10 mg total) by mouth daily. 11/20/23 11/14/24 Yes Sebastian Beverley NOVAK, MD  rosuvastatin  (CRESTOR ) 40 MG tablet Take 1 tablet (40 mg total) by mouth daily. 11/23/23  Yes Sebastian Beverley NOVAK, MD    Current Outpatient Medications  Medication Sig Dispense Refill   amLODipine  (NORVASC ) 10 MG tablet Take 1 tablet (10 mg total) by mouth daily. 90 tablet 3   Blood Glucose Monitoring Suppl DEVI 1 each by Does not apply route in the morning, at noon, and at bedtime. May substitute to any manufacturer covered by patient's insurance. 1 each 0   cholecalciferol (VITAMIN D3) 25 MCG (1000 UNIT) tablet Take 1 tablet (1,000 Units total) by mouth daily. 90 tablet 3   glipiZIDE  (GLUCOTROL ) 5 MG tablet Take 1 tablet (5 mg total) by mouth 2 (two) times daily before a meal. 60 tablet 3   ramipril  (ALTACE ) 10 MG capsule Take 1 capsule (10 mg total) by mouth daily. 90 capsule 3   rosuvastatin  (CRESTOR ) 40 MG tablet Take 1 tablet (40 mg total) by mouth daily. 90 tablet 3   Current Facility-Administered Medications  Medication Dose Route Frequency Provider Last Rate Last Admin   0.9 %  sodium chloride infusion  500 mL Intravenous Once Branden Vine V, DO        Allergies as of 04/25/2024   (No Known Allergies)    Family History  Problem Relation Age of Onset   Cancer Father        prostate cancer   Diabetes Father    Heart disease Father    Colon cancer Paternal Aunt    Rectal cancer Neg Hx    Stomach cancer Neg Hx  Social History   Socioeconomic History   Marital status: Single    Spouse name: Not on file   Number of children: Not on file   Years of education: Not on file   Highest education level: Never attended school  Occupational History   Not on file  Tobacco Use   Smoking status: Never   Smokeless tobacco: Not on file   Tobacco comments:    Nerver smoked  Vaping Use   Vaping status: Never Used  Substance and Sexual Activity   Alcohol use: No   Drug use: No   Sexual activity: Yes    Birth control/protection: None  Other Topics Concern   Not on file  Social History Narrative   ** Merged History Encounter **       Social Drivers of Health    Financial Resource Strain: Medium Risk (01/12/2024)   Overall Financial Resource Strain (CARDIA)    Difficulty of Paying Living Expenses: Somewhat hard  Food Insecurity: No Food Insecurity (01/12/2024)   Hunger Vital Sign    Worried About Running Out of Food in the Last Year: Never true    Ran Out of Food in the Last Year: Never true  Transportation Needs: No Transportation Needs (01/12/2024)   PRAPARE - Administrator, Civil Service (Medical): No    Lack of Transportation (Non-Medical): No  Physical Activity: Insufficiently Active (09/15/2023)   Exercise Vital Sign    Days of Exercise per Week: 5 days    Minutes of Exercise per Session: 10 min  Stress: No Stress Concern Present (09/15/2023)   [employer] of Occupational Health - Occupational Stress Questionnaire    Feeling of Stress : Not at all  Social Connections: Moderately Integrated (09/15/2023)   Social Connection and Isolation Panel    Frequency of Communication with Friends and Family: More than three times a week    Frequency of Social Gatherings with Friends and Family: Three times a week    Attends Religious Services: 1 to 4 times per year    Active Member of Clubs or Organizations: No    Attends Banker Meetings: Not on file    Marital Status: Living with partner  Intimate Partner Violence: Not At Risk (01/12/2024)   Humiliation, Afraid, Rape, and Kick questionnaire    Fear of Current or Ex-Partner: No    Emotionally Abused: No    Physically Abused: No    Sexually Abused: No    Physical Exam: Vital signs in last 24 hours: @BP  (!) 142/76   Pulse (!) 109   Temp 98.4 F (36.9 C)   Ht 5' 5 (1.651 m)   Wt 222 lb (100.7 kg)   SpO2 96%   BMI 36.94 kg/m  GEN: NAD EYE: Sclerae anicteric ENT: MMM CV: Non-tachycardic Pulm: CTA b/l GI: Soft, NT/ND NEURO:  Alert & Oriented x 3   Sandor Flatter, DO [facility]   04/25/2024 10:32 AM

## 2024-04-26 ENCOUNTER — Telehealth: Payer: Self-pay

## 2024-04-26 NOTE — Telephone Encounter (Signed)
 Left message on follow up call.

## 2024-05-04 ENCOUNTER — Telehealth: Payer: Self-pay

## 2024-05-04 ENCOUNTER — Other Ambulatory Visit: Payer: Self-pay

## 2024-05-04 ENCOUNTER — Other Ambulatory Visit (HOSPITAL_COMMUNITY): Payer: Self-pay

## 2024-05-04 DIAGNOSIS — E1122 Type 2 diabetes mellitus with diabetic chronic kidney disease: Secondary | ICD-10-CM

## 2024-05-04 DIAGNOSIS — E782 Mixed hyperlipidemia: Secondary | ICD-10-CM

## 2024-05-04 DIAGNOSIS — I151 Hypertension secondary to other renal disorders: Secondary | ICD-10-CM

## 2024-05-04 NOTE — Progress Notes (Signed)
   05/04/2024  Patient ID: Andrew Garza, male   DOB: April 26, 1976, 48 y.o.   MRN: [id number]  Subjective/Objective Telephone visit to follow-up on management of chronic conditions  Diabetes: Current medications: glipizide  5mg  BID -Diagnosed with diabetes in May with A1c of 7.5% -Has not tried any other medications for diabetes, but patient does not have medication insurance; so cost may be a barrier for certain pharmacotherapies (does not qualify for Medicaid based on HHI) -Has checked home BG a few times since we last spoke and reports the following FBG readings: 105-165 -Patient endorses having OTC glucometer, strips, and lancets from [facility] (too expensive as rx)   Hypertension: Current medications: ramipril  10mg  daily -Patient does have home BP monitor and provides the following recent readings:  106/69, 119/77, 126/87, 115/68, 116/74 -Patient is not taking amlodipine  10mg , because he states the medication seems to give him a headache and cause fatigue.  During our last visit, I advised taking one-half tablet (5mg ) to see if he could tolerate dose, but he was not able to split tablets and did not resume amlodipine  at any dose.   Hyperlipidemia/ASCVD Risk Reduction Current lipid lowering medications: rosuvastatin  40mg  daily -Lipid Panel from May 2025:  TC 305, TG 684, HDL 40.5, Direct LDL 109 Antiplatelet regimen: none   ASCVD History: NA Family History: Heart disease-father Risk Factors: HTN, DM   PREVENT Risk Score: 10 year risk of CVD: 48.2% - 10 year risk of ASCVD: 301% - 10 year risk of HF: 33%   Assessment/Plan:    Diabetes: -Currently uncontrolled with A1c >7% -Continue current regimen at this time -Encouraged balanced meals with lean proteins and whole grain carbohydrates and vegetables; decrease intake of foods/drinks high in sugar and simple carbohydrates -Continue to monitor BG regularly: I recommend to check FBG at least 3x/week and post-prandial BG after largest meal  of the day at least 3x/week -Due for follow-up A1c but no PCP follow-up currently scheduled; PCP visit in November was canceled -Consider addition of metformin XR 500mg  BID (increasing to 2 tablets BID in one month if tolerating well)- this medication is also on [facility] discount drug list   Hypertension: -Controlled based on home BP readings -Continue to hold amlodipine  based on current control to prevent hypotension -Continue ramipril  10mg  daily -Continue to check home blood pressure and heart rate at least 3x/week -If BP were to increase to >130/80 consistently, I recommend resuming amlodipine  at 5mg  dose to see if he tolerates better than 10mg   Hyperlipidemia/ASCVD Risk Reduction: -Currently uncontrolled.  -Recommend to continue current regimen -Recommend follow-up lipid panel and CMP at next PCP visit -If LDL, TC and TG remain elevated, could consider ezetimibe 10mg  daily and/or fenofibrate 145mg  daily- both on  Pharmacy discount list for patient's w/o insurance   Follow Up Plan:  8 weeks   Channing DELENA Mealing, PharmD, DPLA

## 2024-05-04 NOTE — Telephone Encounter (Signed)
 Called Pt to schedule OV; left VM for call back.

## 2024-05-05 ENCOUNTER — Other Ambulatory Visit (HOSPITAL_COMMUNITY): Payer: Self-pay

## 2024-05-09 ENCOUNTER — Ambulatory Visit (HOSPITAL_COMMUNITY): Admission: RE | Admit: 2024-05-09 | Payer: Self-pay | Source: Ambulatory Visit

## 2024-05-09 ENCOUNTER — Telehealth (HOSPITAL_COMMUNITY): Payer: Self-pay | Admitting: Family Medicine

## 2024-05-09 NOTE — Telephone Encounter (Signed)
 Patient has NO SHOWED Echocardiogram x 2 and we will not reach out to reschedule due to patients NO SHOW RATE. Order will be removed from the ECHO WQ.  05/09/24 NO SHOWED - We will not reach out to reschedule due to NO SHOWRATE and pt NO SHOWED x 2 for test/LBW  01/05/24 NO SHOWED-SENT LETTER THRU MY CHART AND COPIED MD

## 2024-05-23 ENCOUNTER — Ambulatory Visit: Payer: Self-pay | Admitting: Family Medicine

## 2024-06-02 ENCOUNTER — Other Ambulatory Visit (HOSPITAL_COMMUNITY): Payer: Self-pay

## 2024-06-30 ENCOUNTER — Telehealth: Payer: Self-pay

## 2024-06-30 ENCOUNTER — Other Ambulatory Visit (INDEPENDENT_AMBULATORY_CARE_PROVIDER_SITE_OTHER): Payer: Self-pay

## 2024-06-30 ENCOUNTER — Other Ambulatory Visit (HOSPITAL_COMMUNITY): Payer: Self-pay

## 2024-06-30 DIAGNOSIS — N1831 Chronic kidney disease, stage 3a: Secondary | ICD-10-CM

## 2024-06-30 DIAGNOSIS — I151 Hypertension secondary to other renal disorders: Secondary | ICD-10-CM

## 2024-06-30 DIAGNOSIS — E782 Mixed hyperlipidemia: Secondary | ICD-10-CM

## 2024-06-30 MED ORDER — GLIPIZIDE 5 MG PO TABS
5.0000 mg | ORAL_TABLET | Freq: Two times a day (BID) | ORAL | 0 refills | Status: DC
  Filled 2024-06-30: qty 60, 30d supply, fill #0

## 2024-06-30 NOTE — Progress Notes (Signed)
 06/30/2024  Patient ID: Elouise Decant, male   DOB: July 08, 1976, 48 y.o.   MRN: [id number]  Subjective/Objective Telephone visit to follow-up on management of chronic conditions  Diabetes: Current medications: glipizide  5mg  BID -Diagnosed with diabetes in May with A1c of 7.5% -Has not tried any other medications for diabetes, but patient does not have medication insurance; so cost may be a barrier for certain pharmacotherapies (does not qualify for Medicaid based on [place]) -Has checked home BG a few times since we last spoke and reports the following FBG readings: 105-165 -Patient endorses having OTC glucometer, strips, and lancets from [facility] (too expensive as rx) -Due for a refill on glipizide , but there are no refills remaining at [facility] -UACR [id number] on 5/9  Lab Results  Component Value Date   HGBA1C 7.5 (H) 11/20/2023        Component Value Date/Time   NA 138 11/20/2023 1459   K 4.1 11/20/2023 1459   CL 102 11/20/2023 1459   CO2 28 11/20/2023 1459   GLUCOSE 107 (H) 11/20/2023 1459   BUN 20 11/20/2023 1459   CREATININE 1.88 (H) 11/20/2023 1459   CALCIUM  9.0 11/20/2023 1459   PROT 6.5 11/20/2023 1459   ALBUMIN 3.7 11/20/2023 1459   AST 17 11/20/2023 1459   ALT 17 11/20/2023 1459   ALKPHOS 77 11/20/2023 1459   BILITOT 0.5 11/20/2023 1459   GFR 42.17 (L) 11/20/2023 1459   GFRNONAA 48 (L) 02/16/2023 1021    Hypertension: Current medications: ramipril  10mg  daily -Patient does have home BP monitor and states BP is averaging 122/76 -Patient did not tolerate amlodipine  in the past, as it caused headaches -Due for refill on ramipril    Hyperlipidemia/ASCVD Risk Reduction Current lipid lowering medications: rosuvastatin  40mg  daily -Due for a refill on rosuvastatin   Antiplatelet regimen: none   ASCVD History: NA Family History: Heart disease-father Risk Factors: HTN, DM   PREVENT Risk Score: 10 year risk of CVD: 48.2% - 10 year risk of ASCVD: 301% - 10 year risk of HF: 33%      Component Value Date/Time   CHOL 305 (H) 11/20/2023 1459   TRIG (H) 11/20/2023 1459    684.0 Triglyceride is over 400; calculations on Lipids are invalid.   HDL 40.50 11/20/2023 1459   CHOLHDL 8 11/20/2023 1459   VLDL 136.8 (H) 11/20/2023 1459   LDLDIRECT 93.0 11/20/2023 1459    Assessment/Plan:    Diabetes: -Currently uncontrolled with A1c >7% but home BG readings reflect improved control -Continue current regimen at this time -Continue to monitor BG regularly: I recommend to check FBG at least 3x/week and post-prandial BG after largest meal of the day at least 3x/week -Due for follow-up A1c but no PCP follow-up currently scheduled and November visit was canceled- patient has started a new job and will have [employer] in 30 days.  He plans to schedule a PCP follow-up in January once he has active coverage. -Order pending for 1 more 30 day supply of glipizide  5mg  BID with note stating patient must be seen for additional refills   Hypertension: -Controlled based on home BP readings -Continue ramipril  10mg  daily -Continue to check home blood pressure and heart rate at least 3x/week -Contacted [facility] to refill ramipril   Hyperlipidemia/ASCVD Risk Reduction: -Currently uncontrolled.  -Recommend to continue current regimen -Recommend follow-up lipid panel and CMP at next PCP visit -If LDL, TC and TG remain elevated, could consider ezetimibe 10mg  daily and/or fenofibrate 145mg  daily -Contacted [facility] to refill rosuvastatin    Follow  Up Plan:  6 weeks   Channing DELENA Mealing, PharmD, DPLA

## 2024-06-30 NOTE — Telephone Encounter (Signed)
 a

## 2024-06-30 NOTE — Telephone Encounter (Signed)
 Called Pt to schedule OV; left VM for call back.

## 2024-07-18 ENCOUNTER — Other Ambulatory Visit (HOSPITAL_COMMUNITY): Payer: Self-pay

## 2024-07-18 MED ORDER — METHOCARBAMOL 500 MG PO TABS
1000.0000 mg | ORAL_TABLET | Freq: Four times a day (QID) | ORAL | 0 refills | Status: AC
  Filled 2024-07-18: qty 45, 6d supply, fill #0

## 2024-07-18 MED ORDER — IBUPROFEN 800 MG PO TABS
800.0000 mg | ORAL_TABLET | Freq: Four times a day (QID) | ORAL | 0 refills | Status: AC
  Filled 2024-07-18: qty 30, 8d supply, fill #0

## 2024-08-01 ENCOUNTER — Other Ambulatory Visit (HOSPITAL_COMMUNITY): Payer: Self-pay

## 2024-08-08 ENCOUNTER — Other Ambulatory Visit: Payer: Self-pay | Admitting: Family Medicine

## 2024-08-08 ENCOUNTER — Other Ambulatory Visit (HOSPITAL_COMMUNITY): Payer: Self-pay

## 2024-08-08 ENCOUNTER — Other Ambulatory Visit: Payer: Self-pay

## 2024-08-08 DIAGNOSIS — E1122 Type 2 diabetes mellitus with diabetic chronic kidney disease: Secondary | ICD-10-CM

## 2024-08-08 MED ORDER — GLIPIZIDE 5 MG PO TABS
5.0000 mg | ORAL_TABLET | Freq: Two times a day (BID) | ORAL | 0 refills | Status: AC
  Filled 2024-08-08: qty 60, 30d supply, fill #0

## 2024-08-12 ENCOUNTER — Other Ambulatory Visit: Payer: Self-pay

## 2024-08-12 DIAGNOSIS — N1831 Chronic kidney disease, stage 3a: Secondary | ICD-10-CM

## 2024-08-12 DIAGNOSIS — E782 Mixed hyperlipidemia: Secondary | ICD-10-CM

## 2024-08-12 DIAGNOSIS — E1122 Type 2 diabetes mellitus with diabetic chronic kidney disease: Secondary | ICD-10-CM

## 2024-08-12 DIAGNOSIS — I151 Hypertension secondary to other renal disorders: Secondary | ICD-10-CM

## 2024-08-12 NOTE — Progress Notes (Signed)
 "  08/12/24  Patient ID: Elouise Decant, male   DOB: [birth year], 49 y.o.   MRN: [id number]  Subjective/Objective Telephone visit to follow-up on management of chronic conditions  Diabetes: Current medications: glipizide  5mg  BID -Diagnosed with diabetes in May with A1c of 7.5% -Has not tried any other medications for diabetes, but patient does not have medication insurance; so cost is a barrier for certain pharmacotherapies (does not qualify for Medicaid based on [facility]) -Patient was supposed to have insurance starting in February through his employer, but he missed open enrollment and will not be eligible again until next year -Has checked home BG a few times since we last spoke and reports the following FBG 109, post-prandial 189 -Does not endorse any instances of hypoglycemia -Patient endorses having OTC glucometer, strips, and lancets from [facility] (too expensive as rx) -Currently out of glipizide , but refill is ready at pharmacy to pick up -UACR [id number] on 5/9  Lab Results  Component Value Date   HGBA1C 7.5 (H) 11/20/2023        Component Value Date/Time   NA 138 11/20/2023 1459   K 4.1 11/20/2023 1459   CL 102 11/20/2023 1459   CO2 28 11/20/2023 1459   GLUCOSE 107 (H) 11/20/2023 1459   BUN 20 11/20/2023 1459   CREATININE 1.88 (H) 11/20/2023 1459   CALCIUM  9.0 11/20/2023 1459   PROT 6.5 11/20/2023 1459   ALBUMIN 3.7 11/20/2023 1459   AST 17 11/20/2023 1459   ALT 17 11/20/2023 1459   ALKPHOS 77 11/20/2023 1459   BILITOT 0.5 11/20/2023 1459   GFR 42.17 (L) 11/20/2023 1459   GFRNONAA 48 (L) 02/16/2023 1021    Hypertension: Current medications: ramipril  10mg  daily -Patient does have home BP monitor and states BP is averaging 120-130/70 -Patient did not tolerate amlodipine  in the past, as it caused headaches -Out of ramipril , but refill ready for pick up at pharmacy  Hyperlipidemia/ASCVD Risk Reduction Current lipid lowering medications: rosuvastatin  40mg  daily -Recently picked up  refill for rosuvastatin   Antiplatelet regimen: none   ASCVD History: NA Family History: Heart disease-father Risk Factors: HTN, DM   PREVENT Risk Score: 10 year risk of CVD: 48.2% - 10 year risk of ASCVD: 301% - 10 year risk of HF: 33%     Component Value Date/Time   CHOL 305 (H) 11/20/2023 1459   TRIG (H) 11/20/2023 1459    684.0 Triglyceride is over 400; calculations on Lipids are invalid.   HDL 40.50 11/20/2023 1459   CHOLHDL 8 11/20/2023 1459   VLDL 136.8 (H) 11/20/2023 1459   LDLDIRECT 93.0 11/20/2023 1459    Assessment/Plan:    Diabetes: -Currently uncontrolled with A1c >7%  -UACR not at goal -LDL not at goal of <70 -BP is at goal of <130/80 -Offered to contact pharmacy to ask them to deliver glipizide  (and ramipril ), but patient states he will pick up today.  Resume ASAP. -Continue to monitor BG regularly: I recommend to check FBG at least 3x/week and post-prandial BG after largest meal of the day at least 3x/week -Due for follow-up A1c but no PCP follow-up currently scheduled and November visit was canceled- advised patient to schedule follow-up, so medications can continue to be refilled- advised to inquire about any discount for cash paying patients w/o insurance   Hypertension: -Controlled based on home BP readings -Resume ramipril  10mg  daily ASAP -Continue to check home blood pressure and heart rate at least 3x/week  Hyperlipidemia/ASCVD Risk Reduction: -Currently uncontrolled.  -Recommend to continue current regimen -  Recommend follow-up lipid panel and CMP at next PCP visit -If LDL, TC and TG remain elevated, could consider ezetimibe 10mg  daily and/or fenofibrate 145mg  daily  Follow Up Plan:  4 weeks   Channing DELENA Mealing, PharmD, DPLA  "

## 2024-09-09 ENCOUNTER — Other Ambulatory Visit: Payer: Self-pay
